# Patient Record
Sex: Female | Born: 1965
Health system: Southern US, Community
[De-identification: ages and names within clinical notes are randomized; demographics above are authoritative.]

## PROBLEM LIST (undated history)

## (undated) DIAGNOSIS — W540XXA Bitten by dog, initial encounter: Secondary | ICD-10-CM

## (undated) DIAGNOSIS — J349 Unspecified disorder of nose and nasal sinuses: Secondary | ICD-10-CM

## (undated) DIAGNOSIS — A64 Unspecified sexually transmitted disease: Secondary | ICD-10-CM

## (undated) DIAGNOSIS — G43909 Migraine, unspecified, not intractable, without status migrainosus: Secondary | ICD-10-CM

## (undated) HISTORY — DX: Bitten by dog, initial encounter: W54.0XXA

## (undated) HISTORY — DX: Unspecified sexually transmitted disease: A64

## (undated) HISTORY — DX: Migraine, unspecified, not intractable, without status migrainosus: G43.909

## (undated) HISTORY — DX: Unspecified disorder of nose and nasal sinuses: J34.9

---

## 1999-03-03 ENCOUNTER — Inpatient Hospital Stay (HOSPITAL_COMMUNITY): Admission: AD | Admit: 1999-03-03 | Discharge: 1999-03-06 | Payer: Self-pay | Admitting: Obstetrics and Gynecology

## 1999-04-14 ENCOUNTER — Other Ambulatory Visit: Admission: RE | Admit: 1999-04-14 | Discharge: 1999-04-14 | Payer: Self-pay | Admitting: Obstetrics and Gynecology

## 2000-11-14 ENCOUNTER — Other Ambulatory Visit: Admission: RE | Admit: 2000-11-14 | Discharge: 2000-11-14 | Payer: Self-pay | Admitting: Obstetrics and Gynecology

## 2003-10-20 ENCOUNTER — Other Ambulatory Visit: Admission: RE | Admit: 2003-10-20 | Discharge: 2003-10-20 | Payer: Self-pay | Admitting: Obstetrics and Gynecology

## 2004-10-26 ENCOUNTER — Other Ambulatory Visit: Admission: RE | Admit: 2004-10-26 | Discharge: 2004-10-26 | Payer: Self-pay | Admitting: Obstetrics and Gynecology

## 2005-12-08 ENCOUNTER — Other Ambulatory Visit: Admission: RE | Admit: 2005-12-08 | Discharge: 2005-12-08 | Payer: Self-pay | Admitting: Obstetrics and Gynecology

## 2007-03-08 ENCOUNTER — Other Ambulatory Visit: Admission: RE | Admit: 2007-03-08 | Discharge: 2007-03-08 | Payer: Self-pay | Admitting: Obstetrics and Gynecology

## 2008-03-12 ENCOUNTER — Other Ambulatory Visit: Admission: RE | Admit: 2008-03-12 | Discharge: 2008-03-12 | Payer: Self-pay | Admitting: Obstetrics and Gynecology

## 2008-07-03 HISTORY — PX: POLYPECTOMY: SHX149

## 2009-04-15 ENCOUNTER — Other Ambulatory Visit: Admission: RE | Admit: 2009-04-15 | Discharge: 2009-04-15 | Payer: Self-pay | Admitting: Obstetrics and Gynecology

## 2009-04-20 ENCOUNTER — Encounter: Admission: RE | Admit: 2009-04-20 | Discharge: 2009-04-20 | Payer: Self-pay | Admitting: Obstetrics and Gynecology

## 2009-04-27 ENCOUNTER — Encounter: Admission: RE | Admit: 2009-04-27 | Discharge: 2009-04-27 | Payer: Self-pay | Admitting: Obstetrics and Gynecology

## 2009-05-13 ENCOUNTER — Other Ambulatory Visit: Admission: RE | Admit: 2009-05-13 | Discharge: 2009-05-13 | Payer: Self-pay | Admitting: Obstetrics and Gynecology

## 2010-04-21 ENCOUNTER — Encounter: Admission: RE | Admit: 2010-04-21 | Discharge: 2010-04-21 | Payer: Self-pay | Admitting: Obstetrics and Gynecology

## 2011-03-27 ENCOUNTER — Other Ambulatory Visit: Payer: Self-pay | Admitting: Obstetrics and Gynecology

## 2011-03-27 DIAGNOSIS — Z1231 Encounter for screening mammogram for malignant neoplasm of breast: Secondary | ICD-10-CM

## 2011-04-27 ENCOUNTER — Ambulatory Visit
Admission: RE | Admit: 2011-04-27 | Discharge: 2011-04-27 | Disposition: A | Discharge status: Home / Self Care | Payer: BC Managed Care – PPO | Source: Ambulatory Visit | Attending: Obstetrics and Gynecology | Admitting: Obstetrics and Gynecology

## 2011-04-27 DIAGNOSIS — Z1231 Encounter for screening mammogram for malignant neoplasm of breast: Secondary | ICD-10-CM

## 2012-04-11 ENCOUNTER — Other Ambulatory Visit: Payer: Self-pay | Admitting: Obstetrics and Gynecology

## 2012-04-11 DIAGNOSIS — Z1231 Encounter for screening mammogram for malignant neoplasm of breast: Secondary | ICD-10-CM

## 2012-05-03 ENCOUNTER — Ambulatory Visit
Admission: RE | Admit: 2012-05-03 | Discharge: 2012-05-03 | Disposition: A | Discharge status: Home / Self Care | Payer: BC Managed Care – PPO | Source: Ambulatory Visit | Attending: Obstetrics and Gynecology | Admitting: Obstetrics and Gynecology

## 2012-05-03 DIAGNOSIS — Z1231 Encounter for screening mammogram for malignant neoplasm of breast: Secondary | ICD-10-CM

## 2012-09-26 ENCOUNTER — Other Ambulatory Visit: Payer: Self-pay | Admitting: Neurology

## 2012-09-30 ENCOUNTER — Telehealth: Payer: Self-pay | Admitting: *Deleted

## 2012-09-30 NOTE — Telephone Encounter (Signed)
Patient calling needing a follow up appointment so that she could get a refill on Relpax 40 mg. CM/Willis

## 2012-10-02 ENCOUNTER — Other Ambulatory Visit: Payer: Self-pay | Admitting: Neurology

## 2012-10-04 ENCOUNTER — Telehealth: Payer: Self-pay

## 2012-10-04 MED ORDER — ELETRIPTAN HYDROBROMIDE 40 MG PO TABS: 40.0000 mg | ORAL_TABLET | ORAL | Status: DC | PRN

## 2012-10-04 NOTE — Telephone Encounter (Signed)
Patient called requesting refill on Relpax be sent to [facility].

## 2012-11-16 ENCOUNTER — Encounter: Payer: Self-pay | Admitting: Nurse Practitioner

## 2012-11-18 ENCOUNTER — Ambulatory Visit: Payer: Self-pay | Admitting: Nurse Practitioner

## 2012-12-03 ENCOUNTER — Encounter: Payer: Self-pay | Admitting: Certified Nurse Midwife

## 2012-12-05 ENCOUNTER — Ambulatory Visit (INDEPENDENT_AMBULATORY_CARE_PROVIDER_SITE_OTHER): Payer: BC Managed Care – PPO | Admitting: Certified Nurse Midwife

## 2012-12-05 ENCOUNTER — Encounter: Payer: Self-pay | Admitting: Certified Nurse Midwife

## 2012-12-05 ENCOUNTER — Ambulatory Visit (INDEPENDENT_AMBULATORY_CARE_PROVIDER_SITE_OTHER): Payer: BC Managed Care – PPO | Admitting: Nurse Practitioner

## 2012-12-05 ENCOUNTER — Encounter: Payer: Self-pay | Admitting: Nurse Practitioner

## 2012-12-05 VITALS — BP 94/62 | Ht 65.25 in | Wt 173.0 lb

## 2012-12-05 VITALS — BP 104/69 | HR 88 | Ht 65.5 in | Wt 174.0 lb

## 2012-12-05 DIAGNOSIS — IMO0001 Reserved for inherently not codable concepts without codable children: Secondary | ICD-10-CM

## 2012-12-05 DIAGNOSIS — G43009 Migraine without aura, not intractable, without status migrainosus: Secondary | ICD-10-CM | POA: Insufficient documentation

## 2012-12-05 DIAGNOSIS — Z309 Encounter for contraceptive management, unspecified: Secondary | ICD-10-CM

## 2012-12-05 DIAGNOSIS — Z01419 Encounter for gynecological examination (general) (routine) without abnormal findings: Secondary | ICD-10-CM

## 2012-12-05 DIAGNOSIS — G43019 Migraine without aura, intractable, without status migrainosus: Secondary | ICD-10-CM | POA: Insufficient documentation

## 2012-12-05 MED ORDER — TOPIRAMATE 25 MG PO TABS: 50.0000 mg | ORAL_TABLET | Freq: Two times a day (BID) | ORAL | Status: DC

## 2012-12-05 MED ORDER — NORETHIN ACE-ETH ESTRAD-FE 1-20 MG-MCG(24) PO CHEW
1.0000 | CHEWABLE_TABLET | Freq: Every day | ORAL | Status: DC
Start: 2012-12-05 — End: 2012-12-05

## 2012-12-05 NOTE — Progress Notes (Signed)
HPI: Returns for a yearly followup for migraines. She has tried Depakote in the past but had side effects of significant tremor. She is currently on Topamax 50 mg twice daily tolerating the medication without side effects and her headache frequency is 1 headache per month or less. She takes Relpax acutely with relief . Her headaches continue to be unilateral and frontotemporal and can be associated with nausea and vomiting, photophobia, and photosensitivity. She has not missed any work due to her headaches. 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 No new medical issues since last seen  ROS:  Negative  Physical Exam General: well developed, well nourished, seated, in no evident distress Head: head normocephalic and atraumatic. Oropharynx benign Neck: supple with no carotid  bruits Cardiovascular: regular rate and rhythm, no murmurs  Neurologic Exam Mental Status: Awake and fully alert. Oriented to place and time. Follows all commands. Speech and language normal.   Cranial Nerves:  Pupils equal, briskly reactive to light. Extraocular movements full without nystagmus. Visual fields full to confrontation. Hearing intact and symmetric to finger snap. Facial sensation intact. Face, tongue, palate move normally and symmetrically. Neck flexion and extension normal.  Motor: Normal bulk and tone. Normal strength in all tested extremity muscles.No focal weakness Sensory.: intact to touch and pinprick and vibratory.  Coordination: Rapid alternating movements normal in all extremities. Finger-to-nose and heel-to-shin performed accurately bilaterally. Gait and Station: Arises from chair without difficulty. Stance is normal. Gait demonstrates normal stride length and balance . Able to heel, toe and tandem walk without difficulty.  Reflexes: 2+ and symmetric. Toes downgoing.     ASSESSMENT: Migraine headaches in excellent control with Topamax     PLAN: Continue topiramate and Relpax. Will renew Followup yearly  and when necessary Nilda Riggs, GNP-BC APRN

## 2012-12-05 NOTE — Progress Notes (Signed)
47 y.o. G82P1001 Married Caucasian Fe here for annual exam. Periods normal now with chewing Minastrin 24 Fe. Busy with work, no health issues today. Sees PCP for aex and labs.    Patient's last menstrual period was 09/05/2012.          Sexually active: no  The current method of family planning is OCP (estrogen/progesterone).    Exercising: yes  walking Smoker:  no  Health Maintenance: Pap: 6--4-13 neg HPV HR neg MMG:  05-03-12 Colonoscopy:  2007 BMD:   none TDaP:  08-17-11 Labs: none Self breast exam: monthly   reports that she has never smoked. She has never used smokeless tobacco. She reports that she does not drink alcohol or use illicit drugs.  Past Medical History  Diagnosis Date  . Migraine   . Sinus problem   . STD (sexually transmitted disease)     HSV 2  . Migraines     no aura    Past Surgical History  Procedure Laterality Date  . Cesarean section  2000  . Polypectomy  2010    Current Outpatient Prescriptions  Medication Sig Dispense Refill  . eletriptan (RELPAX) 40 MG tablet One tablet by mouth at onset of headache. May repeat in 2 hours if headache persists or recurs. may repeat in 2 hours if necessary  6 tablet  1  . loratadine (CLARITIN) 10 MG tablet Take 10 mg by mouth as needed for allergies.      . Norethin Ace-Eth Estrad-FE (MINASTRIN 24 FE) 1-20 MG-MCG(24) CHEW Chew by mouth daily.      Marland Kitchen topiramate (TOPAMAX) 25 MG tablet Take 25 mg by mouth 2 (two) times daily. Take 2 po bid       No current facility-administered medications for this visit.    Family History  Problem Relation Age of Onset  . Cancer - Colon Paternal Grandmother   . Heart disease Maternal Grandmother   . Hypertension Mother     ROS:  Pertinent items are noted in HPI.  Otherwise, a comprehensive ROS was negative.  Exam:   BP 94/62  Ht 5' 5.25" (1.657 m)  Wt 173 lb (78.472 kg)  BMI 28.58 kg/m2  LMP 09/05/2012 Height: 5' 5.25" (165.7 cm)  Ht Readings from Last 3 Encounters:   12/05/12 5' 5.25" (1.657 m)  11/16/12 5\' 6"  (1.676 m)    General appearance: alert, cooperative and appears stated age Head: Normocephalic, without obvious abnormality, atraumatic Neck: no adenopathy, supple, symmetrical, trachea midline and thyroid normal to inspection and palpation Lungs: clear to auscultation bilaterally Breasts: normal appearance, no masses or tenderness, Inspection negative, No nipple retraction or dimpling, No nipple discharge or bleeding, No axillary or supraclavicular adenopathy Heart: regular rate and rhythm Abdomen: soft, non-tender; no masses,  no organomegaly Extremities: extremities normal, atraumatic, no cyanosis or edema Skin: Skin color, texture, turgor normal. No rashes or lesions Lymph nodes: Cervical, supraclavicular, and axillary nodes normal. No abnormal inguinal nodes palpated Neurologic: Grossly normal   Pelvic: External genitalia:  no lesions              Urethra:  normal appearing urethra with no masses, tenderness or lesions              Bartholin's and Skene's: normal                 Vagina: normal appearing vagina with normal color and discharge, no lesions              Cervix:  normal, non tender              Pap taken: no Bimanual Exam:  Uterus:  normal size, contour, position, consistency, mobility, non-tender and mid position              Adnexa: normal adnexa and no mass, fullness, tenderness               Rectovaginal: Confirms               Anus:  normal sphincter tone, no lesions  A:  Well Woman with normal exam  Contraception: OCP  P:   Reviewed health and wellness pertinent to exam  Rx minastrin24 Fe  Pap smear as per guidelines   Mammogram yearly pap smear not taken  counseled on mammography screening, use and side effects of OCP's, adequate intake of calcium and vitamin D, diet and exercise  return annually or prn  An After Visit Summary was printed and given to the patient.  Reviewed, TL

## 2012-12-05 NOTE — Patient Instructions (Addendum)
Continue Topamax will renew Continue Relpax F/U yearly and prn

## 2012-12-05 NOTE — Patient Instructions (Addendum)

## 2012-12-05 NOTE — Progress Notes (Signed)
I have read the note, and I agree with the clinical assessment and plan.  

## 2012-12-22 ENCOUNTER — Other Ambulatory Visit: Payer: Self-pay | Admitting: Neurology

## 2013-02-19 ENCOUNTER — Ambulatory Visit: Payer: Self-pay | Admitting: Nurse Practitioner

## 2013-03-28 ENCOUNTER — Other Ambulatory Visit: Payer: Self-pay

## 2013-03-28 MED ORDER — ELETRIPTAN HYDROBROMIDE 40 MG PO TABS: 40.0000 mg | ORAL_TABLET | ORAL | Status: DC | PRN

## 2013-03-28 NOTE — Telephone Encounter (Signed)
Patient called stating her insurance will be changing as of Oct 1st.  She would like a new Rx for Relpax called into [facility] for 90 days.

## 2013-05-12 ENCOUNTER — Other Ambulatory Visit: Payer: Self-pay

## 2013-05-12 DIAGNOSIS — Z1231 Encounter for screening mammogram for malignant neoplasm of breast: Secondary | ICD-10-CM

## 2013-06-02 ENCOUNTER — Telehealth: Payer: Self-pay | Admitting: Certified Nurse Midwife

## 2013-06-02 MED ORDER — NORETHIN ACE-ETH ESTRAD-FE 1-20 MG-MCG(24) PO CHEW: 1.0000 | CHEWABLE_TABLET | Freq: Every day | ORAL | Status: DC

## 2013-06-02 NOTE — Telephone Encounter (Signed)
Minastrin 24 FE chewable. Patient take three months continuously and omits last week due to migraines. Patient has new insurance and needs a new RX to: [employer] [phone]

## 2013-06-02 NOTE — Telephone Encounter (Signed)
AEX was was 12/05/12 #1 pack with 12 refills was sent to local pharmacy, [facility].   #90/2 [facility] to last patient until AEX  Patient notified.

## 2013-06-03 ENCOUNTER — Other Ambulatory Visit: Payer: Self-pay | Admitting: *Deleted

## 2013-06-03 MED ORDER — NORETHIN ACE-ETH ESTRAD-FE 1-20 MG-MCG(24) PO CHEW: 1.0000 | CHEWABLE_TABLET | Freq: Every day | ORAL | Status: DC

## 2013-06-09 ENCOUNTER — Ambulatory Visit
Admission: RE | Admit: 2013-06-09 | Discharge: 2013-06-09 | Disposition: A | Discharge status: Home / Self Care | Payer: No Typology Code available for payment source | Source: Ambulatory Visit

## 2013-06-09 DIAGNOSIS — Z1231 Encounter for screening mammogram for malignant neoplasm of breast: Secondary | ICD-10-CM

## 2013-12-05 ENCOUNTER — Ambulatory Visit (INDEPENDENT_AMBULATORY_CARE_PROVIDER_SITE_OTHER): Payer: No Typology Code available for payment source | Admitting: Nurse Practitioner

## 2013-12-05 ENCOUNTER — Encounter: Payer: Self-pay | Admitting: Nurse Practitioner

## 2013-12-05 ENCOUNTER — Encounter (INDEPENDENT_AMBULATORY_CARE_PROVIDER_SITE_OTHER): Payer: Self-pay

## 2013-12-05 VITALS — BP 112/77 | HR 79 | Ht 65.5 in | Wt 171.0 lb

## 2013-12-05 DIAGNOSIS — G43019 Migraine without aura, intractable, without status migrainosus: Secondary | ICD-10-CM

## 2013-12-05 DIAGNOSIS — G43009 Migraine without aura, not intractable, without status migrainosus: Secondary | ICD-10-CM

## 2013-12-05 MED ORDER — ELETRIPTAN HYDROBROMIDE 40 MG PO TABS: 40.0000 mg | ORAL_TABLET | ORAL | Status: DC | PRN

## 2013-12-05 MED ORDER — TOPIRAMATE 25 MG PO TABS
50.0000 mg | ORAL_TABLET | Freq: Two times a day (BID) | ORAL | Status: DC
Start: 2013-12-05 — End: 2014-12-07

## 2013-12-05 NOTE — Patient Instructions (Signed)
Continue Topamax at current dose will refill Continue Relpax at current dose will refill, co-pay card given Followup yearly and when necessary

## 2013-12-05 NOTE — Progress Notes (Signed)
I have read the note, and I agree with the clinical assessment and plan.  Wiktoria Hemrick K Malaya Cagley   

## 2013-12-05 NOTE — Progress Notes (Signed)
[facility]  PATIENT: Rachel Anthony DOB: February 16, 1966   REASON FOR VISIT: Followup for migraine  HISTORY OF PRESENT ILLNESS: Rachel Anthony, 48 year old female returns for followup. She has history of migraines. She was last seen 12/05/2012.She has tried Depakote in the past but had side effects of significant tremor. She is currently on Topamax 50 mg twice daily tolerating the medication without side effects and her headache frequency is 1 headache per month or less. She takes Relpax acutely with relief . Her headaches continue to be unilateral and frontotemporal and can be associated with nausea and vomiting, photophobia, and photosensitivity. She has not missed any work due to her headaches. 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 No new medical issues since last seen. She does not have a primary care physician   REVIEW OF SYSTEMS: Full 14 system review of systems performed and notable only for those listed, all others are neg:  Constitutional: N/A  Cardiovascular: N/A  Ear/Nose/Throat: N/A  Skin: N/A  Eyes: N/A  Respiratory: N/A  Gastroitestinal: N/A  Hematology/Lymphatic: N/A  Endocrine: N/A Musculoskeletal:N/A  Allergy/Immunology: N/A  Neurological: Headache Psychiatric: N/A Sleep : NA   ALLERGIES: No Known Allergies  HOME MEDICATIONS: Outpatient Prescriptions Prior to Visit  Medication Sig Dispense Refill  . eletriptan (RELPAX) 40 MG tablet Take 1 tablet (40 mg total) by mouth as needed for migraine. May repeat in 2 hours if headache persists or recurs. (90 day Rx)  18 tablet  1  . loratadine (CLARITIN) 10 MG tablet Take 10 mg by mouth as needed for allergies.      . Norethin Ace-Eth Estrad-FE (MINASTRIN 24 FE) 1-20 MG-MCG(24) CHEW Chew 1 tablet by mouth daily.  90 tablet  2  . topiramate (TOPAMAX) 25 MG tablet Take 2 tablets (50 mg total) by mouth 2 (two) times daily.  120 tablet  11   No facility-administered medications prior to visit.    PAST MEDICAL  HISTORY: Past Medical History  Diagnosis Date  . Migraine   . Sinus problem   . STD (sexually transmitted disease)     HSV 2  . Migraines     no aura    PAST SURGICAL HISTORY: Past Surgical History  Procedure Laterality Date  . Cesarean section  2000  . Polypectomy  2010    FAMILY HISTORY: Family History  Problem Relation Age of Onset  . Cancer - Colon Paternal Grandmother   . Heart disease Maternal Grandmother   . Hypertension Mother     SOCIAL HISTORY: History   Social History  . Marital Status: Divorced    Spouse Name: N/A    Number of Children: 1  . Years of Education: 12+   Occupational History  .     Social History Main Topics  . Smoking status: Never Smoker   . Smokeless tobacco: Never Used  . Alcohol Use: No  . Drug Use: No  . Sexual Activity: No   Other Topics Concern  . Not on file   Social History Narrative   Patient works as a Education officer, museum at [employer] for 13 years and has her masters degree. The patient is divorced and lives with her daughter.      PHYSICAL EXAM  Filed Vitals:   12/05/13 1029  BP: 112/77  Pulse: 79  Height: 5' 5.5" (1.664 m)  Weight: 171 lb (77.565 kg)   Body mass index is 28.01 kg/(m^2).  Generalized: Well developed, in no acute distress  Head: normocephalic and atraumatic,. Oropharynx benign  Neck: Supple, no carotid bruits  Cardiac: Regular rate rhythm, no murmur  Musculoskeletal: No deformity   Neurological examination   Mentation: Alert oriented to time, place, history taking. Follows all commands speech and language fluent  Cranial nerve II-XII: Pupils were equal round reactive to light extraocular movements were full, visual field were full on confrontational test. Facial sensation and strength were normal. hearing was intact to finger rubbing bilaterally. Uvula tongue midline. head turning and shoulder shrug were normal and symmetric.Tongue protrusion into cheek strength was  normal. Motor: normal bulk and tone, full strength in the BUE, BLE, fine finger movements normal, no pronator drift. No focal weakness Sensory: normal and symmetric to light touch, pinprick, and  vibration  Coordination: finger-nose-finger, heel-to-shin bilaterally, no dysmetria Reflexes: Brachioradialis 2/2, biceps 2/2, triceps 2/2, patellar 2/2, Achilles 2/2, plantar responses were flexor bilaterally. Gait and Station: Rising up from seated position without assistance, normal stance,  moderate stride, good arm swing, smooth turning, able to perform tiptoe, and heel walking without difficulty. Tandem gait is steady  DIAGNOSTIC DATA (LABS, IMAGING, TESTING) -    ASSESSMENT AND PLAN  48 y.o. year old female  has a past medical history of Migraine; here to followup. She is currently well controlled with Topamax.  Continue Topamax at current dose will refill Continue Relpax at current dose will refill, co-pay card given Followup yearly and when necessary Dennie Bible, [facility], [facility], [facility] [address], [place],  [address] [phone]

## 2013-12-11 ENCOUNTER — Ambulatory Visit (INDEPENDENT_AMBULATORY_CARE_PROVIDER_SITE_OTHER): Payer: No Typology Code available for payment source | Admitting: Certified Nurse Midwife

## 2013-12-11 ENCOUNTER — Encounter: Payer: Self-pay | Admitting: Certified Nurse Midwife

## 2013-12-11 VITALS — BP 106/62 | HR 70 | Resp 16 | Ht 64.75 in | Wt 169.0 lb

## 2013-12-11 DIAGNOSIS — Z01419 Encounter for gynecological examination (general) (routine) without abnormal findings: Secondary | ICD-10-CM

## 2013-12-11 DIAGNOSIS — Z124 Encounter for screening for malignant neoplasm of cervix: Secondary | ICD-10-CM

## 2013-12-11 DIAGNOSIS — Z309 Encounter for contraceptive management, unspecified: Secondary | ICD-10-CM

## 2013-12-11 MED ORDER — NORETHIN ACE-ETH ESTRAD-FE 1-20 MG-MCG(24) PO CHEW: 1.0000 | CHEWABLE_TABLET | Freq: Every day | ORAL | Status: DC

## 2013-12-11 NOTE — Progress Notes (Signed)
48 y.o. G76P1001 Divorced Caucasian Fe here for annual exam. Periods every 3 months with OCP ues, normal, no issues.  Contraception working well,no issues. Sees PCP aex/ labs yearly. Sees Neurology yearly for migraines. No health issues. Going on mission trip to [place] this summer!  Patient's last menstrual period was 09/25/2013.          Sexually active: no  The current method of family planning is OCP (estrogen/progesterone).    Exercising: yes  walking Smoker:  no  Health Maintenance: Pap: 12-05-11 neg HPV HR neg MMG: 06-09-13 normal Colonoscopy:  2007 BMD:   none TDaP:  2013 Labs: none Self breast exam: done monthly   reports that she has never smoked. She has never used smokeless tobacco. She reports that she does not drink alcohol or use illicit drugs.  Past Medical History  Diagnosis Date  . Migraine   . Sinus problem   . STD (sexually transmitted disease)     HSV 2  . Migraines     no aura    Past Surgical History  Procedure Laterality Date  . Cesarean section  2000  . Polypectomy  2010    Current Outpatient Prescriptions  Medication Sig Dispense Refill  . eletriptan (RELPAX) 40 MG tablet Take 1 tablet (40 mg total) by mouth as needed for migraine. May repeat in 2 hours if headache persists or recurs. (90 day Rx)  18 tablet  3  . loratadine (CLARITIN) 10 MG tablet Take 10 mg by mouth as needed for allergies.      . Norethin Ace-Eth Estrad-FE (MINASTRIN 24 FE) 1-20 MG-MCG(24) CHEW Chew 1 tablet by mouth daily.  90 tablet  2  . topiramate (TOPAMAX) 25 MG tablet Take 2 tablets (50 mg total) by mouth 2 (two) times daily.  120 tablet  11   No current facility-administered medications for this visit.    Family History  Problem Relation Age of Onset  . Cancer - Colon Paternal Grandmother   . Heart disease Maternal Grandmother   . Hypertension Mother     ROS:  Pertinent items are noted in HPI.  Otherwise, a comprehensive ROS was negative.  Exam:   BP  106/62  Pulse 70  Resp 16  Ht 5' 4.75" (1.645 m)  Wt 169 lb (76.658 kg)  BMI 28.33 kg/m2  LMP 09/25/2013 Height: 5' 4.75" (164.5 cm)  Ht Readings from Last 3 Encounters:  12/11/13 5' 4.75" (1.645 m)  12/05/13 5' 5.5" (1.664 m)  12/05/12 5' 5.5" (1.664 m)    General appearance: alert, cooperative and appears stated age Head: Normocephalic, without obvious abnormality, atraumatic Neck: no adenopathy, supple, symmetrical, trachea midline and thyroid normal to inspection and palpation and non-palpable Lungs: clear to auscultation bilaterally Breasts: normal appearance, no masses or tenderness, No nipple retraction or dimpling, No nipple discharge or bleeding, No axillary or supraclavicular adenopathy Heart: regular rate and rhythm Abdomen: soft, non-tender; no masses,  no organomegaly Extremities: extremities normal, atraumatic, no cyanosis or edema Skin: Skin color, texture, turgor normal. No rashes or lesions Lymph nodes: Cervical, supraclavicular, and axillary nodes normal. No abnormal inguinal nodes palpated Neurologic: Grossly normal   Pelvic: External genitalia:  no lesions              Urethra:  normal appearing urethra with no masses, tenderness or lesions              Bartholin's and Skene's: normal  Vagina: normal appearing vagina with normal color and discharge, no lesions              Cervix: normal, non tender              Pap taken: yes Bimanual Exam:  Uterus:  normal size, contour, position, consistency, mobility, non-tender, anteverted and retroverted              Adnexa: normal adnexa and no mass, fullness, tenderness               Rectovaginal: Confirms               Anus:  normal sphincter tone, no lesions  A:  Well Woman with normal exam  Contraception cycle control only with OCP  Migraine headache under neurology management    P:   Reviewed health and wellness pertinent to exam  Rx Minastrin 24 see order  Continue follow up with neurology  as indicated  Pap smear taken today with HPV reflex   counseled on breast self exam, mammography screening, adequate intake of calcium and vitamin D, diet and exercise return annually or prn  An After Visit Summary was printed and given to the patient.

## 2013-12-11 NOTE — Patient Instructions (Signed)

## 2013-12-12 LAB — IPS PAP TEST WITH REFLEX TO HPV

## 2013-12-13 NOTE — Progress Notes (Signed)
Reviewed personally.  M. Suzanne Dystany Duffy, MD.  

## 2014-05-04 ENCOUNTER — Encounter: Payer: Self-pay | Admitting: Certified Nurse Midwife

## 2014-07-09 ENCOUNTER — Other Ambulatory Visit: Payer: Self-pay

## 2014-07-09 DIAGNOSIS — Z1231 Encounter for screening mammogram for malignant neoplasm of breast: Secondary | ICD-10-CM

## 2014-07-15 ENCOUNTER — Other Ambulatory Visit: Payer: Self-pay | Admitting: Family Medicine

## 2014-07-15 DIAGNOSIS — R222 Localized swelling, mass and lump, trunk: Secondary | ICD-10-CM

## 2014-07-15 DIAGNOSIS — R2230 Localized swelling, mass and lump, unspecified upper limb: Secondary | ICD-10-CM

## 2014-07-16 ENCOUNTER — Ambulatory Visit: Payer: No Typology Code available for payment source

## 2014-07-21 ENCOUNTER — Ambulatory Visit
Admission: RE | Admit: 2014-07-21 | Discharge: 2014-07-21 | Disposition: A | Discharge status: Home / Self Care | Payer: No Typology Code available for payment source | Source: Ambulatory Visit | Attending: Family Medicine | Admitting: Family Medicine

## 2014-07-21 DIAGNOSIS — R2230 Localized swelling, mass and lump, unspecified upper limb: Secondary | ICD-10-CM

## 2014-07-21 DIAGNOSIS — R222 Localized swelling, mass and lump, trunk: Secondary | ICD-10-CM

## 2014-09-02 ENCOUNTER — Telehealth: Payer: Self-pay | Admitting: Nurse Practitioner

## 2014-09-02 NOTE — Telephone Encounter (Signed)
Pt is calling stating that [facility] needs to get prior auth on eletriptan (RELPAX) 40 MG tablet. Pt states she only has one pill left. Please call and advise.

## 2014-09-02 NOTE — Telephone Encounter (Signed)
All clinical info has been provided to ins, request is under review.  I called the patient back.  Got no answer.  Left message.

## 2014-09-04 ENCOUNTER — Telehealth: Payer: Self-pay

## 2014-09-04 NOTE — Telephone Encounter (Signed)
Rachel Anthony has approved the request for coverage on Relpax effective until 09/03/2015 Ref Auth # [id number]

## 2014-12-07 ENCOUNTER — Ambulatory Visit (INDEPENDENT_AMBULATORY_CARE_PROVIDER_SITE_OTHER): Payer: No Typology Code available for payment source | Admitting: Nurse Practitioner

## 2014-12-07 ENCOUNTER — Ambulatory Visit: Payer: No Typology Code available for payment source | Admitting: Nurse Practitioner

## 2014-12-07 ENCOUNTER — Encounter: Payer: Self-pay | Admitting: Nurse Practitioner

## 2014-12-07 VITALS — BP 114/71 | HR 61 | Ht 65.5 in | Wt 154.5 lb

## 2014-12-07 DIAGNOSIS — G43009 Migraine without aura, not intractable, without status migrainosus: Secondary | ICD-10-CM

## 2014-12-07 MED ORDER — ELETRIPTAN HYDROBROMIDE 40 MG PO TABS: 40.0000 mg | ORAL_TABLET | ORAL | Status: DC | PRN

## 2014-12-07 MED ORDER — TOPIRAMATE 25 MG PO TABS: 50.0000 mg | ORAL_TABLET | Freq: Two times a day (BID) | ORAL | Status: DC

## 2014-12-07 NOTE — Progress Notes (Signed)
I have read the note, and I agree with the clinical assessment and plan.  Houston Zapien KEITH   

## 2014-12-07 NOTE — Progress Notes (Signed)
[facility]  PATIENT: Rachel Anthony DOB: 1965/08/04   REASON FOR VISIT: Follow-up for migraines  HISTORY FROM: Patient    HISTORY OF PRESENT ILLNESS:[name], 49 year old female returns for followup. She has history of migraines. She was last seen 12/05/2013.She has tried Depakote in the past but had side effects of significant tremor. She is currently on Topamax 50 mg twice daily tolerating the medication without side effects and her headache frequency is 1 headache per month or less. She takes Relpax acutely with relief . Her headaches continue to be unilateral and frontotemporal and can be associated with nausea and vomiting, photophobia, and photosensitivity. She has not missed any work due to her headaches. 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 No new medical issues since last seen. She returns for reevaluation   REVIEW OF SYSTEMS: Full 14 system review of systems performed and notable only for those listed, all others are neg:  Constitutional: neg  Cardiovascular: neg Ear/Nose/Throat: neg  Skin: neg Eyes: neg Respiratory: neg Gastroitestinal: neg  Hematology/Lymphatic: neg  Endocrine: neg Musculoskeletal:neg Allergy/Immunology: neg Neurological: Headaches Psychiatric: neg Sleep : neg   ALLERGIES: No Known Allergies  HOME MEDICATIONS: Outpatient Prescriptions Prior to Visit  Medication Sig Dispense Refill  . eletriptan (RELPAX) 40 MG tablet Take 1 tablet (40 mg total) by mouth as needed for migraine. May repeat in 2 hours if headache persists or recurs. (90 day Rx) 18 tablet 3  . loratadine (CLARITIN) 10 MG tablet Take 10 mg by mouth as needed for allergies.    Marland Kitchen topiramate (TOPAMAX) 25 MG tablet Take 2 tablets (50 mg total) by mouth 2 (two) times daily. 120 tablet 11  . Norethin Ace-Eth Estrad-FE (MINASTRIN 24 FE) 1-20 MG-MCG(24) CHEW Chew 1 tablet by mouth daily. 90 tablet 4   No facility-administered medications prior to visit.    PAST  MEDICAL HISTORY: Past Medical History  Diagnosis Date  . Migraine   . Sinus problem   . STD (sexually transmitted disease)     HSV 2  . Migraines     no aura    PAST SURGICAL HISTORY: Past Surgical History  Procedure Laterality Date  . Cesarean section  2000  . Polypectomy  2010    FAMILY HISTORY: Family History  Problem Relation Age of Onset  . Cancer - Colon Paternal Grandmother   . Heart disease Maternal Grandmother   . Hypertension Mother     SOCIAL HISTORY: History   Social History  . Marital Status: Divorced    Spouse Name: N/A  . Number of Children: 1  . Years of Education: 12+   Occupational History  .     Social History Main Topics  . Smoking status: Never Smoker   . Smokeless tobacco: Never Used  . Alcohol Use: No  . Drug Use: No  . Sexual Activity: No   Other Topics Concern  . 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 The patient is divorced and lives with her daughter.      PHYSICAL EXAM  Filed Vitals:   12/07/14 1551  BP: 114/71  Pulse: 61  Height: 5' 5.5" (1.664 m)  Weight: 154 lb 8 oz (70.081 kg)   Body mass index is 25.31 kg/(m^2). Generalized: Well developed, in no acute distress  Head: normocephalic and atraumatic,. Oropharynx benign  Neck: Supple, no carotid bruits  Musculoskeletal: No deformity   Neurological examination   Mentation: Alert oriented to time, place, history taking. Follows all commands speech and language fluent  Cranial nerve II-XII: Pupils were equal round reactive to light extraocular movements were full, visual field were full on confrontational test. Facial sensation and strength were normal. hearing was intact to finger rubbing bilaterally. Uvula tongue midline. head turning and shoulder shrug were normal and symmetric.Tongue protrusion into cheek strength was normal. Motor: normal bulk  and tone, full strength in the BUE, BLE, fine finger movements normal, no pronator drift. No focal weakness Coordination: finger-nose-finger, heel-to-shin bilaterally, no dysmetria Reflexes: Brachioradialis 2/2, biceps 2/2, triceps 2/2, patellar 2/2, Achilles 2/2, plantar responses were flexor bilaterally. Gait and Station: Rising up from seated position without assistance, normal stance, moderate stride, good arm swing, smooth turning, able to perform tiptoe, and heel walking without difficulty. Tandem gait is steady  DIAGNOSTIC DATA (LABS, IMAGING, TESTING) - ASSESSMENT AND PLAN  49 y.o. year old female  has a past medical history of Migraine; here to follow-up. She has one headache per month relieved with Relpax acutely she is on Topamax as a preventive.  Continue Topamax at current dose will refill Continue Relpax at current dose will refill Call for an increase in headaches Follow-up yearly and when necessary Dennie Bible, [facility], [facility], APRN  [facility] [address] [place], [place] [address] [phone] L or stress is right-handed. He currently eats expensive for 60 crazy when JUST is status just is likely patient is

## 2014-12-07 NOTE — Patient Instructions (Signed)
Continue Topamax at current dose will refill Continue Relpax at current dose will refill Call for an increase in headaches Follow-up yearly and when necessary

## 2014-12-17 ENCOUNTER — Encounter: Payer: Self-pay | Admitting: Certified Nurse Midwife

## 2014-12-17 ENCOUNTER — Ambulatory Visit (INDEPENDENT_AMBULATORY_CARE_PROVIDER_SITE_OTHER): Payer: No Typology Code available for payment source | Admitting: Certified Nurse Midwife

## 2014-12-17 VITALS — BP 110/68 | HR 78 | Resp 16 | Ht 65.25 in | Wt 152.0 lb

## 2014-12-17 DIAGNOSIS — Z01419 Encounter for gynecological examination (general) (routine) without abnormal findings: Secondary | ICD-10-CM

## 2014-12-17 NOTE — Patient Instructions (Signed)

## 2014-12-17 NOTE — Progress Notes (Signed)
49 y.o. G12P1001 Divorced  Caucasian Fe here for annual exam.  Periods normal, no issues. Not sexually active now. Has been working on weight loss down 17 pounds!  Sees PCP prn. Sees neurology for migraines, no change in status. Busy with Hospice care. Stopped OCP use due to cost. Migraines are manageable without OCP's use now. Prefers to stay off of now. Sees PCP for aex/labs and migraine medication. No health issues today.  Patient's last menstrual period was 12/03/2014.          Sexually active: No.  The current method of family planning is abstinence.    Exercising: Yes.    walking Smoker:  no  Health Maintenance: Pap: 12-11-13 neg MMG:  07-21-14 category c density,birads 1:neg Colonoscopy:  2007 BMD:   none TDaP:  2013 Labs: none Self breast exam: done monthly   reports that she has never smoked. She has never used smokeless tobacco. She reports that she does not drink alcohol or use illicit drugs.  Past Medical History  Diagnosis Date  . Migraine   . Sinus problem   . STD (sexually transmitted disease)     HSV 2  . Migraines     no aura    Past Surgical History  Procedure Laterality Date  . Cesarean section  2000  . Polypectomy  2010    Current Outpatient Prescriptions  Medication Sig Dispense Refill  . eletriptan (RELPAX) 40 MG tablet Take 1 tablet (40 mg total) by mouth as needed for migraine. May repeat in 2 hours if headache persists or recurs. (90 day Rx) 6 tablet 11  . loratadine (CLARITIN) 10 MG tablet Take 10 mg by mouth as needed for allergies.    Marland Kitchen topiramate (TOPAMAX) 25 MG tablet Take 2 tablets (50 mg total) by mouth 2 (two) times daily. 120 tablet 11   No current facility-administered medications for this visit.    Family History  Problem Relation Age of Onset  . Cancer - Colon Paternal Grandmother   . Heart disease Maternal Grandmother   . Hypertension Mother     ROS:  Pertinent items are noted in HPI.  Otherwise, a comprehensive ROS was  negative.  Exam:   BP 110/68 mmHg  Pulse 78  Resp 16  Ht 5' 5.25" (1.657 m)  Wt 152 lb (68.947 kg)  BMI 25.11 kg/m2  LMP 12/03/2014 Height: 5' 5.25" (165.7 cm) Ht Readings from Last 3 Encounters:  12/17/14 5' 5.25" (1.657 m)  12/07/14 5' 5.5" (1.664 m)  12/11/13 5' 4.75" (1.645 m)    General appearance: alert, cooperative and appears stated age Head: Normocephalic, without obvious abnormality, atraumatic Neck: no adenopathy, supple, symmetrical, trachea midline and thyroid normal to inspection and palpation Lungs: clear to auscultation bilaterally Breasts: normal appearance, no masses or tenderness, No nipple retraction or dimpling, No nipple discharge or bleeding, No axillary or supraclavicular adenopathy Heart: regular rate and rhythm Abdomen: soft, non-tender; no masses,  no organomegaly Extremities: extremities normal, atraumatic, no cyanosis or edema Skin: Skin color, texture, turgor normal. No rashes or lesions Lymph nodes: Cervical, supraclavicular, and axillary nodes normal. No abnormal inguinal nodes palpated Neurologic: Grossly normal   Pelvic: External genitalia:  no lesions              Urethra:  normal appearing urethra with no masses, tenderness or lesions              Bartholin's and Skene's: normal  Vagina: normal appearing vagina with normal color and discharge, no lesions              Cervix: normal, non tender, no lesions              Pap taken: No. Bimanual Exam:  Uterus:  normal size, contour, position, consistency, mobility, non-tender and anteverted tilts to left              Adnexa: normal adnexa and no mass, fullness, tenderness               Rectovaginal: Confirms               Anus:  normal sphincter tone, no lesions  Chaperone present: Yes  A:  Well Woman with normal exam  Contraception abstinence  Migraine headaches stable with PCP management  P:   Reviewed health and wellness pertinent to exam  Continue follow up with MD as  indicated.  Pap smear not taken today    counseled on breast self exam, mammography screening, adequate intake of calcium and vitamin D, diet and exercise  return annually or prn  An After Visit Summary was printed and given to the patient.

## 2014-12-18 NOTE — Progress Notes (Signed)
Reviewed personally.  M. Suzanne Shailee Foots, MD.  

## 2015-08-30 ENCOUNTER — Other Ambulatory Visit: Payer: Self-pay

## 2015-08-30 DIAGNOSIS — Z1231 Encounter for screening mammogram for malignant neoplasm of breast: Secondary | ICD-10-CM

## 2015-09-02 ENCOUNTER — Ambulatory Visit
Admission: RE | Admit: 2015-09-02 | Discharge: 2015-09-02 | Disposition: A | Discharge status: Home / Self Care | Payer: Managed Care, Other (non HMO) | Source: Ambulatory Visit

## 2015-09-02 DIAGNOSIS — Z1231 Encounter for screening mammogram for malignant neoplasm of breast: Secondary | ICD-10-CM

## 2015-12-06 ENCOUNTER — Ambulatory Visit (INDEPENDENT_AMBULATORY_CARE_PROVIDER_SITE_OTHER): Payer: 59 | Admitting: Nurse Practitioner

## 2015-12-06 ENCOUNTER — Encounter: Payer: Self-pay | Admitting: Nurse Practitioner

## 2015-12-06 VITALS — BP 103/71 | HR 74 | Resp 16 | Ht 65.0 in | Wt 145.0 lb

## 2015-12-06 DIAGNOSIS — G43009 Migraine without aura, not intractable, without status migrainosus: Secondary | ICD-10-CM

## 2015-12-06 MED ORDER — ELETRIPTAN HYDROBROMIDE 40 MG PO TABS: 40.0000 mg | ORAL_TABLET | ORAL | Status: DC | PRN

## 2015-12-06 MED ORDER — TOPIRAMATE 25 MG PO TABS: 50.0000 mg | ORAL_TABLET | Freq: Two times a day (BID) | ORAL | Status: DC

## 2015-12-06 NOTE — Progress Notes (Signed)
[facility]  PATIENT: Rachel Anthony DOB: [birth year]   REASON FOR VISIT: Follow-up for migraine HISTORY FROM: Patient    HISTORY OF PRESENT ILLNESS:Rachel Anthony, 50 year old female returns for followup. She has history of migraines. She was last seen 12/07/2014. Marland KitchenShe has tried Depakote in the past but had side effects of significant tremor. She is currently on Topamax 50 mg twice daily tolerating the medication without side effects and her headache frequency is 1-2  headaches per month or less. She takes Relpax acutely with relief . Her headaches continue to be unilateral and frontotemporal and can be associated with nausea and vomiting, photophobia, and photosensitivity. She has not missed any work due to her headaches. 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 No new medical issues since last seen. She also has seasonal allergies. She returns for reevaluation   REVIEW OF SYSTEMS: Full 14 system review of systems performed and notable only for those listed, all others are neg:  Constitutional: neg  Cardiovascular: neg Ear/Nose/Throat: neg  Skin: neg Eyes: neg Respiratory: neg Gastroitestinal: neg  Hematology/Lymphatic: neg  Endocrine: neg Musculoskeletal:neg Allergy/Immunology: Seasonal Neurological: Headache  Psychiatric: neg Sleep : neg   ALLERGIES: No Known Allergies  HOME MEDICATIONS: Outpatient Prescriptions Prior to Visit  Medication Sig Dispense Refill  . eletriptan (RELPAX) 40 MG tablet Take 1 tablet (40 mg total) by mouth as needed for migraine. May repeat in 2 hours if headache persists or recurs. (90 day Rx) 6 tablet 11  . loratadine (CLARITIN) 10 MG tablet Take 10 mg by mouth as needed for allergies.    Marland Kitchen topiramate (TOPAMAX) 25 MG tablet Take 2 tablets (50 mg total) by mouth 2 (two) times daily. 120 tablet 11   No facility-administered medications prior to visit.    PAST MEDICAL HISTORY: Past Medical History  Diagnosis Date  . Migraine   .  Sinus problem   . STD (sexually transmitted disease)     HSV 2  . Migraines     no aura    PAST SURGICAL HISTORY: Past Surgical History  Procedure Laterality Date  . Cesarean section  2000  . Polypectomy  2010    FAMILY HISTORY: Family History  Problem Relation Age of Onset  . Cancer - Colon Paternal Grandmother   . Heart disease Maternal Grandmother   . Hypertension Mother     SOCIAL HISTORY: Social History   Social History  . Marital Status: Divorced    Spouse Name: N/A  . Number of Children: 1  . Years of Education: 12+   Occupational History  .     Social History Main Topics  . Smoking status: Never Smoker   . Smokeless tobacco: Never Used  . Alcohol Use: No  . Drug Use: No  . Sexual Activity: No   Other Topics Concern  . 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 The patient is divorced and lives with her daughter.      PHYSICAL EXAM  Filed Vitals:   12/06/15 0809  BP: 103/71  Pulse: 74  Resp: 16  Height: 5\' 5"  (1.651 m)  Weight: 145 lb (65.772 kg)   Body mass index is 24.13 kg/(m^2). Generalized: Well developed, in no acute distress  Head: normocephalic and atraumatic,. Oropharynx benign  Neck: Supple, no carotid bruits  Musculoskeletal: No deformity   Neurological examination   Mentation: Alert  oriented to time, place, history taking. Follows all commands speech and language fluent  Cranial nerve II-XII: Pupils were equal round reactive to light extraocular movements were full, visual field were full on confrontational test. Facial sensation and strength were normal. hearing was intact to finger rubbing bilaterally. Uvula tongue midline. head turning and shoulder shrug were normal and symmetric.Tongue protrusion into cheek strength was normal. Motor: normal bulk and tone, full strength in the BUE, BLE, fine finger movements  normal, no pronator drift. No focal weakness Coordination: finger-nose-finger, heel-to-shin bilaterally, no dysmetria Reflexes: Brachioradialis 2/2, biceps 2/2, triceps 2/2, patellar 2/2, Achilles 2/2, plantar responses were flexor bilaterally. Gait and Station: Rising up from seated position without assistance, normal stance, moderate stride, good arm swing, smooth turning, able to perform tiptoe, and heel walking without difficulty. Tandem gait is steady  DIAGNOSTIC DATA (LABS, IMAGING, TESTING)  ASSESSMENT AND PLAN 50 y.o. year old female has a past medical history of Migraine; here to follow-up. She has 1 to 2  headaches per month relieved with Relpax acutely , she is on Topamax as a preventive.  Continue Topamax 50mg  twice daily. will refill Continue Relpax at current dose will refill Call for an increase in headaches Follow-up yearly and when necessary Dennie Bible, [facility], [facility], [facility] [address] [place], [place] [address] [phone]

## 2015-12-06 NOTE — Patient Instructions (Signed)
Continue Topamax at current dose will refill Continue Relpax at current dose will refill Call for an increase in headaches Follow-up yearly and when necessary 

## 2015-12-06 NOTE — Progress Notes (Signed)
I have read the note, and I agree with the clinical assessment and plan.  Milarose Savich KEITH   

## 2015-12-21 ENCOUNTER — Ambulatory Visit (INDEPENDENT_AMBULATORY_CARE_PROVIDER_SITE_OTHER): Payer: 59 | Admitting: Certified Nurse Midwife

## 2015-12-21 ENCOUNTER — Encounter: Payer: Self-pay | Admitting: Certified Nurse Midwife

## 2015-12-21 VITALS — BP 100/62 | HR 76 | Resp 16 | Ht 64.75 in | Wt 143.0 lb

## 2015-12-21 DIAGNOSIS — Z124 Encounter for screening for malignant neoplasm of cervix: Secondary | ICD-10-CM

## 2015-12-21 DIAGNOSIS — Z01419 Encounter for gynecological examination (general) (routine) without abnormal findings: Secondary | ICD-10-CM | POA: Diagnosis not present

## 2015-12-21 NOTE — Progress Notes (Signed)
50 y.o. G64P1001 Divorced  Caucasian Fe here for annual exam. Periods normal,no issues. Not sexually active. Sees Dr. Rolland Bimler for aex, and labs. No health issues over the past year. No health concerns today. Daughter senior this coming year!!  Patient's last menstrual period was 12/14/2015.          Sexually active: No.  The current method of family planning is none.    Exercising: Yes.    Walking, Zumba Smoker:  no  Health Maintenance: Pap: 12/11/13 WNL MMG:  09/02/15-BIRADS1 Density C Colonoscopy:  2008  Family history of colon cancer repeat 10 years. BMD:   Never TDaP:  08/17/11 Shingles: Never Pneumonia: Never Hep C and HIV: Never Labs: PCP   reports that she has never smoked. She has never used smokeless tobacco. She reports that she does not drink alcohol or use illicit drugs.  Past Medical History  Diagnosis Date  . Migraine   . Sinus problem   . STD (sexually transmitted disease)     HSV 2  . Migraines     no aura    Past Surgical History  Procedure Laterality Date  . Cesarean section  2000  . Polypectomy  2010    Current Outpatient Prescriptions  Medication Sig Dispense Refill  . eletriptan (RELPAX) 40 MG tablet Take 1 tablet (40 mg total) by mouth as needed for migraine. May repeat in 2 hours if headache persists or recurs. (90 day Rx) 6 tablet 11  . loratadine (CLARITIN) 10 MG tablet Take 10 mg by mouth as needed for allergies.    Marland Kitchen topiramate (TOPAMAX) 25 MG tablet Take 2 tablets (50 mg total) by mouth 2 (two) times daily. 120 tablet 11   No current facility-administered medications for this visit.    Family History  Problem Relation Age of Onset  . Cancer - Colon Paternal Grandmother   . Heart disease Maternal Grandmother   . Hypertension Mother     ROS:  Pertinent items are noted in HPI.  Otherwise, a comprehensive ROS was negative.  Exam:   BP 100/62 mmHg  Pulse 76  Resp 16  Ht 5' 4.75" (1.645 m)  Wt 143 lb (64.864 kg)  BMI 23.97 kg/m2  LMP  12/14/2015 Height: 5' 4.75" (164.5 cm) Ht Readings from Last 3 Encounters:  12/21/15 5' 4.75" (1.645 m)  12/06/15 5\' 5"  (1.651 m)  12/17/14 5' 5.25" (1.657 m)    General appearance: alert, cooperative and appears stated age Head: Normocephalic, without obvious abnormality, atraumatic Neck: no adenopathy, supple, symmetrical, trachea midline and thyroid normal to inspection and palpation Lungs: clear to auscultation bilaterally Breasts: normal appearance, no masses or tenderness, No nipple retraction or dimpling, No nipple discharge or bleeding, No axillary or supraclavicular adenopathy Heart: regular rate and rhythm Abdomen: soft, non-tender; no masses,  no organomegaly Extremities: extremities normal, atraumatic, no cyanosis or edema Skin: Skin color, texture, turgor normal. No rashes or lesions Lymph nodes: Cervical, supraclavicular, and axillary nodes normal. No abnormal inguinal nodes palpated Neurologic: Grossly normal   Pelvic: External genitalia:  no lesions              Urethra:  normal appearing urethra with no masses, tenderness or lesions              Bartholin's and Skene's: normal                 Vagina: normal appearing vagina with normal color and discharge, no lesions  Cervix: multiparous appearance, no cervical motion tenderness and no lesions              Pap taken: Yes.   Bimanual Exam:  Uterus:  normal size, contour, position, consistency, mobility, non-tender              Adnexa: normal adnexa and no mass, fullness, tenderness               Rectovaginal: Confirms               Anus:  normal sphincter tone, no lesions  Chaperone present: yes  A:  Well Woman with normal exam  Contraception none needed, not sexually active  History of Migraine headache with MD management  P:   Reviewed health and wellness pertinent to exam  Continue follow up with PCP  Pap smear as above with HPVHR   counseled on breast self exam, mammography screening, adequate  intake of calcium and vitamin D, diet and exercise  return annually or prn  An After Visit Summary was printed and given to the patient.

## 2015-12-21 NOTE — Patient Instructions (Signed)

## 2015-12-22 NOTE — Progress Notes (Signed)
Reviewed personally.  M. Suzanne Shyonna Carlin, MD.  

## 2015-12-24 LAB — IPS PAP TEST WITH HPV

## 2016-08-23 ENCOUNTER — Other Ambulatory Visit: Payer: Self-pay | Admitting: Family Medicine

## 2016-08-23 DIAGNOSIS — Z1231 Encounter for screening mammogram for malignant neoplasm of breast: Secondary | ICD-10-CM

## 2016-09-07 ENCOUNTER — Ambulatory Visit
Admission: RE | Admit: 2016-09-07 | Discharge: 2016-09-07 | Disposition: A | Discharge status: Home / Self Care | Payer: Managed Care, Other (non HMO) | Source: Ambulatory Visit | Attending: Family Medicine | Admitting: Family Medicine

## 2016-09-07 DIAGNOSIS — Z1231 Encounter for screening mammogram for malignant neoplasm of breast: Secondary | ICD-10-CM

## 2016-12-06 ENCOUNTER — Ambulatory Visit (INDEPENDENT_AMBULATORY_CARE_PROVIDER_SITE_OTHER): Payer: 59 | Admitting: Nurse Practitioner

## 2016-12-06 ENCOUNTER — Encounter: Payer: Self-pay | Admitting: Nurse Practitioner

## 2016-12-06 ENCOUNTER — Encounter (INDEPENDENT_AMBULATORY_CARE_PROVIDER_SITE_OTHER): Payer: Self-pay

## 2016-12-06 VITALS — BP 112/72 | HR 76 | Ht 65.5 in | Wt 158.0 lb

## 2016-12-06 DIAGNOSIS — G43009 Migraine without aura, not intractable, without status migrainosus: Secondary | ICD-10-CM

## 2016-12-06 MED ORDER — TOPIRAMATE 25 MG PO TABS: 50.0000 mg | ORAL_TABLET | Freq: Two times a day (BID) | ORAL | 11 refills | Status: DC

## 2016-12-06 MED ORDER — ELETRIPTAN HYDROBROMIDE 40 MG PO TABS: 40.0000 mg | ORAL_TABLET | ORAL | 11 refills | Status: DC | PRN

## 2016-12-06 NOTE — Patient Instructions (Signed)
Continue Topamax 50mg  twice daily. will refill Continue Relpax at current dose will refill Call for an increase in headaches Follow-up yearly and when necessary

## 2016-12-06 NOTE — Progress Notes (Signed)
[facility]  PATIENT: Rachel Anthony DOB: 11-Jan-1966   REASON FOR VISIT: Follow-up for migraine HISTORY FROM: Patient    HISTORY OF PRESENT ILLNESS:Rachel Anthony, 51 year old female returns for yearly followup. She has history of migraines. .She has tried Depakote in the past but had side effects of significant tremor. She is currently on Topamax 50 mg twice daily tolerating the medication without side effects and her headache frequency is 1-2  headaches per month or less. She takes Relpax acutely with relief . Her headaches continue to be unilateral and frontotemporal and can be associated with nausea and vomiting, photophobia, and photosensitivity. She has not missed any work due to her headaches. 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 No new medical issues since last seen. She also has seasonal allergies. She returns for reevaluation   REVIEW OF SYSTEMS: Full 14 system review of systems performed and notable only for those listed, all others are neg:  Constitutional: neg  Cardiovascular: neg Ear/Nose/Throat: neg  Skin: neg Eyes: neg Respiratory: neg Gastroitestinal: neg  Hematology/Lymphatic: neg  Endocrine: neg Musculoskeletal:neg Allergy/Immunology: Seasonal Neurological: Headache  Psychiatric: neg Sleep : neg   ALLERGIES: No Known Allergies  HOME MEDICATIONS: Outpatient Medications Prior to Visit  Medication Sig Dispense Refill  . eletriptan (RELPAX) 40 MG tablet Take 1 tablet (40 mg total) by mouth as needed for migraine. May repeat in 2 hours if headache persists or recurs. (90 day Rx) 6 tablet 11  . loratadine (CLARITIN) 10 MG tablet Take 10 mg by mouth as needed for allergies.    Marland Kitchen topiramate (TOPAMAX) 25 MG tablet Take 2 tablets (50 mg total) by mouth 2 (two) times daily. 120 tablet 11   No facility-administered medications prior to visit.     PAST MEDICAL HISTORY: Past Medical History:  Diagnosis Date  . Migraine   . Migraines    no aura    . Sinus problem   . STD (sexually transmitted disease)    HSV 2    PAST SURGICAL HISTORY: Past Surgical History:  Procedure Laterality Date  . CESAREAN SECTION  2000  . POLYPECTOMY  2010    FAMILY HISTORY: Family History  Problem Relation Age of Onset  . Hypertension Mother   . Cancer - Colon Paternal Grandmother   . Heart disease Maternal Grandmother     SOCIAL HISTORY: Social History   Social History  . Marital status: Divorced    Spouse name: N/A  . Number of children: 1  . Years of education: 12+   Occupational History  .  [employer]   Social History Main Topics  . Smoking status: Never Smoker  . Smokeless tobacco: Never Used  . Alcohol use No  . Drug use: No  . Sexual activity: No   Other Topics Concern  . 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 The patient is divorced and lives with her daughter.      PHYSICAL EXAM  Vitals:   12/06/16 0734  BP: 112/72  Pulse: 76  Weight: 158 lb (71.7 kg)  Height: 5' 5.5" (1.664 m)   Body mass index is 25.89 kg/m. Generalized: Well developed, in no acute distress  Head: normocephalic and atraumatic,. Oropharynx benign  Neck: Supple,  Musculoskeletal: No deformity   Neurological examination   Mentation: Alert oriented to time, place, history taking. Follows all  commands speech and language fluent  Cranial nerve II-XII: Pupils were equal round reactive to light extraocular movements were full, visual field were full on confrontational test. Facial sensation and strength were normal. hearing was intact to finger rubbing bilaterally. Uvula tongue midline. head turning and shoulder shrug were normal and symmetric.Tongue protrusion into cheek strength was normal. Motor: normal bulk and tone, full strength in the BUE, BLE, fine finger movements normal, no pronator drift. No  focal weakness Coordination: finger-nose-finger, heel-to-shin bilaterally, no dysmetria Reflexes: Brachioradialis 2/2, biceps 2/2, triceps 2/2, patellar 2/2, Achilles 2/2, plantar responses were flexor bilaterally. Gait and Station: Rising up from seated position without assistance, normal stance, moderate stride, good arm swing, smooth turning, able to perform tiptoe, and heel walking without difficulty. Tandem gait is steady  DIAGNOSTIC DATA (LABS, IMAGING, TESTING)  ASSESSMENT AND PLAN 51 y.o. year old female has a past medical history of Migraine; here to follow-up. She has 1 to 2  headaches per month relieved with Relpax acutely , she is on Topamax as a preventive.  PLAN: Continue Topamax 50mg  twice daily. will refill Discussed tapering off of Topamax to see if her headaches are stable however she is not willing to do this at this time. But she also states she does not want to take the drug forever.  Continue Relpax at current dose will refill Call for an increase in headaches Follow-up yearly and when necessary I spent 15 min  in total face to face time with the patient more than 50% of which was spent counseling and coordination of care, reviewing test results reviewing medications and discussing and reviewing the diagnosis of migraine, avoiding migraine triggers and keeping a record of headaches if they  increase.  Dennie Bible, [facility], [facility], APRN  [facility] [address] [place], [place] [address] [phone]

## 2016-12-06 NOTE — Progress Notes (Signed)
I have read the note, and I agree with the clinical assessment and plan.  Rachel Anthony,Rachel Anthony   

## 2016-12-21 ENCOUNTER — Ambulatory Visit: Payer: 59 | Admitting: Certified Nurse Midwife

## 2017-01-04 ENCOUNTER — Encounter: Payer: Self-pay | Admitting: Certified Nurse Midwife

## 2017-01-04 ENCOUNTER — Ambulatory Visit (INDEPENDENT_AMBULATORY_CARE_PROVIDER_SITE_OTHER): Payer: 59 | Admitting: Certified Nurse Midwife

## 2017-01-04 VITALS — BP 100/60 | HR 68 | Resp 16 | Ht 64.75 in | Wt 160.0 lb

## 2017-01-04 DIAGNOSIS — Z8669 Personal history of other diseases of the nervous system and sense organs: Secondary | ICD-10-CM | POA: Diagnosis not present

## 2017-01-04 DIAGNOSIS — Z01419 Encounter for gynecological examination (general) (routine) without abnormal findings: Secondary | ICD-10-CM | POA: Diagnosis not present

## 2017-01-04 NOTE — Progress Notes (Signed)
51 y.o. G11P1001 Divorced  Caucasian Fe here for annual exam. Periods normal, no issues. Contraception not needed. No HSV 2 outbreaks. Sees PCP annually with labs and medication management for migraine headache, all stable. Not sexually active..No health issues today. Staying busy with Hospice work. Planning trip with family.   Patient's last menstrual period was 12/15/2016 (exact date).          Sexually active: No.  The current method of family planning is abstinence.    Exercising: Yes.    walking Smoker:  no  Health Maintenance: Pap:  12-11-13 neg, 12-21-15 neg HPV HR neg History of Abnormal Pap: no MMG:  09-07-16 category c density birads 1:neg Self Breast exams: yes Colonoscopy:  2008 f/u 21yrs BMD:   none TDaP:  2013 Shingles: no Pneumonia: no Hep C and HIV: HIV neg before Labs: none   reports that she has never smoked. She has never used smokeless tobacco. She reports that she does not drink alcohol or use drugs.  Past Medical History:  Diagnosis Date  . Migraine   . Migraines    no aura  . Sinus problem   . STD (sexually transmitted disease)    HSV 2    Past Surgical History:  Procedure Laterality Date  . CESAREAN SECTION  2000  . POLYPECTOMY  2010    Current Outpatient Prescriptions  Medication Sig Dispense Refill  . eletriptan (RELPAX) 40 MG tablet Take 1 tablet (40 mg total) by mouth as needed for migraine. May repeat in 2 hours if headache persists or recurs. (90 day Rx) 6 tablet 11  . loratadine (CLARITIN) 10 MG tablet Take 10 mg by mouth as needed for allergies.    Marland Kitchen topiramate (TOPAMAX) 25 MG tablet Take 2 tablets (50 mg total) by mouth 2 (two) times daily. 120 tablet 11   No current facility-administered medications for this visit.     Family History  Problem Relation Age of Onset  . Hypertension Mother   . Cancer - Colon Paternal Grandmother   . Heart disease Maternal Grandmother     ROS:  Pertinent items are noted in HPI.  Otherwise, a  comprehensive ROS was negative.  Exam:   BP 100/60   Pulse 68   Resp 16   Ht 5' 4.75" (1.645 m)   Wt 160 lb (72.6 kg)   LMP 12/15/2016 (Exact Date)   BMI 26.83 kg/m  Height: 5' 4.75" (164.5 cm) Ht Readings from Last 3 Encounters:  01/04/17 5' 4.75" (1.645 m)  12/06/16 5' 5.5" (1.664 m)  12/21/15 5' 4.75" (1.645 m)    General appearance: alert, cooperative and appears stated age Head: Normocephalic, without obvious abnormality, atraumatic Neck: no adenopathy, supple, symmetrical, trachea midline and thyroid normal to inspection and palpation Lungs: clear to auscultation bilaterally Breasts: normal appearance, no masses or tenderness, No nipple retraction or dimpling, No nipple discharge or bleeding, No axillary or supraclavicular adenopathy Heart: regular rate and rhythm Abdomen: soft, non-tender; no masses,  no organomegaly Extremities: extremities normal, atraumatic, no cyanosis or edema Skin: Skin color, texture, turgor normal. No rashes or lesions Lymph nodes: Cervical, supraclavicular, and axillary nodes normal. No abnormal inguinal nodes palpated Neurologic: Grossly normal   Pelvic: External genitalia:  no lesions              Urethra:  normal appearing urethra with no masses, tenderness or lesions              Bartholin's and Skene's: normal  Vagina: normal appearing vagina with normal color and discharge, no lesions              Cervix: multiparous appearance, no cervical motion tenderness and no lesions              Pap taken: No. Bimanual Exam:  Uterus:  normal size, contour, position, consistency, mobility, non-tender              Adnexa: normal adnexa and no mass, fullness, tenderness               Rectovaginal: Confirms               Anus:  normal sphincter tone, no lesions  Chaperone present: yes  A:  Well Woman with normal exam  Contraception none needed, not sexually active  Migraine headache history with PCP managment  P:   Reviewed  health and wellness pertinent to exam  Will advise if need changes  Aware of warning signs with migraine and will advise.  Pap smear: no   counseled on breast self exam, mammography screening, adequate intake of calcium and vitamin D, diet and exercise  return annually or prn  An After Visit Summary was printed and given to the patient.

## 2017-07-24 DIAGNOSIS — S61411A Laceration without foreign body of right hand, initial encounter: Secondary | ICD-10-CM | POA: Diagnosis not present

## 2017-07-24 DIAGNOSIS — S60221A Contusion of right hand, initial encounter: Secondary | ICD-10-CM | POA: Diagnosis not present

## 2017-07-25 ENCOUNTER — Telehealth: Payer: Self-pay | Admitting: *Deleted

## 2017-07-25 NOTE — Telephone Encounter (Signed)
Approval received this am for RELPAX from 07/25/2017 thru 07/25/2018 [employer].  PA# [employer] Value-Value Plus- SI PPO [id number] SS.    No phone #, fax is [phone].  Faxed to [facility] [phone] (confirmation received).

## 2017-07-25 NOTE — Telephone Encounter (Signed)
[place] and pt has new insurance.  Rachel Anthony [id number], [place] [id number] Group [id number]  Phone # [phone].  Cover [employer] [id number] initiated for Relpax BN approval. Pt has been on relpax since 2009 approx.  Has tried excedrin migraine, amitriptylline, maxalt samples, depakote ER (caused temor), topamax, and relpax (not work as well as BN).  Diagnosis G43.009.

## 2017-09-03 DIAGNOSIS — Z131 Encounter for screening for diabetes mellitus: Secondary | ICD-10-CM | POA: Diagnosis not present

## 2017-09-03 DIAGNOSIS — Z13220 Encounter for screening for lipoid disorders: Secondary | ICD-10-CM | POA: Diagnosis not present

## 2017-09-03 DIAGNOSIS — M79641 Pain in right hand: Secondary | ICD-10-CM | POA: Diagnosis not present

## 2017-09-03 DIAGNOSIS — Z0000 Encounter for general adult medical examination without abnormal findings: Secondary | ICD-10-CM | POA: Diagnosis not present

## 2017-09-03 DIAGNOSIS — Z1211 Encounter for screening for malignant neoplasm of colon: Secondary | ICD-10-CM | POA: Diagnosis not present

## 2017-09-05 DIAGNOSIS — Z1211 Encounter for screening for malignant neoplasm of colon: Secondary | ICD-10-CM | POA: Diagnosis not present

## 2017-09-06 ENCOUNTER — Other Ambulatory Visit: Payer: Self-pay | Admitting: Family Medicine

## 2017-09-06 DIAGNOSIS — Z1231 Encounter for screening mammogram for malignant neoplasm of breast: Secondary | ICD-10-CM

## 2017-09-18 DIAGNOSIS — M79644 Pain in right finger(s): Secondary | ICD-10-CM | POA: Diagnosis not present

## 2017-09-21 ENCOUNTER — Ambulatory Visit
Admission: RE | Admit: 2017-09-21 | Discharge: 2017-09-21 | Disposition: A | Discharge status: Home / Self Care | Payer: 59 | Source: Ambulatory Visit | Attending: Family Medicine | Admitting: Family Medicine

## 2017-09-21 DIAGNOSIS — Z1231 Encounter for screening mammogram for malignant neoplasm of breast: Secondary | ICD-10-CM

## 2017-09-25 DIAGNOSIS — M79644 Pain in right finger(s): Secondary | ICD-10-CM | POA: Diagnosis not present

## 2017-09-25 DIAGNOSIS — L988 Other specified disorders of the skin and subcutaneous tissue: Secondary | ICD-10-CM | POA: Diagnosis not present

## 2017-09-28 DIAGNOSIS — L988 Other specified disorders of the skin and subcutaneous tissue: Secondary | ICD-10-CM | POA: Insufficient documentation

## 2017-10-03 DIAGNOSIS — M79644 Pain in right finger(s): Secondary | ICD-10-CM | POA: Diagnosis not present

## 2017-10-03 DIAGNOSIS — L988 Other specified disorders of the skin and subcutaneous tissue: Secondary | ICD-10-CM | POA: Diagnosis not present

## 2017-10-22 DIAGNOSIS — M79644 Pain in right finger(s): Secondary | ICD-10-CM | POA: Diagnosis not present

## 2017-12-06 NOTE — Progress Notes (Signed)
[facility]  PATIENT: MERELIN HUMAN DOB: 01-17-1966   REASON FOR VISIT: Follow-up for migraine HISTORY FROM: Patient    HISTORY OF PRESENT ILLNESS:Ms. Fabio Neighbors, 52 year old female returns for yearly followup. She has history of migraines. .She has tried Depakote in the past but had side effects of significant tremor. She is currently on Topamax 50 mg twice daily tolerating the medication without side effects and her headache frequency is 1-2  headaches per month or less. She takes Relpax BRAND acutely with relief . Her headaches continue to be unilateral and frontotemporal and can be associated with nausea and vomiting, photophobia, and photosensitivity. She has not missed any work due to her headaches. 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 No new medical issues since last seen. She also has seasonal allergies that cause some of her headaches. She returns for reevaluation   REVIEW OF SYSTEMS: Full 14 system review of systems performed and notable only for those listed, all others are neg:  Constitutional: neg  Cardiovascular: neg Ear/Nose/Throat: neg  Skin: neg Eyes: neg Respiratory: neg Gastroitestinal: neg  Hematology/Lymphatic: neg  Endocrine: neg Musculoskeletal:neg Allergy/Immunology: Seasonal Neurological: Headache  Psychiatric: neg Sleep : neg   ALLERGIES: No Known Allergies  HOME MEDICATIONS: Outpatient Medications Prior to Visit  Medication Sig Dispense Refill  . eletriptan (RELPAX) 40 MG tablet Take 1 tablet (40 mg total) by mouth as needed for migraine. May repeat in 2 hours if headache persists or recurs. (90 day Rx) 6 tablet 11  . loratadine (CLARITIN) 10 MG tablet Take 10 mg by mouth as needed for allergies.    Marland Kitchen topiramate (TOPAMAX) 25 MG tablet Take 2 tablets (50 mg total) by mouth 2 (two) times daily. 120 tablet 11   No facility-administered medications prior to visit.     PAST MEDICAL HISTORY: Past Medical History:  Diagnosis Date    . Migraine   . Migraines    no aura  . Sinus problem   . STD (sexually transmitted disease)    HSV 2    PAST SURGICAL HISTORY: Past Surgical History:  Procedure Laterality Date  . CESAREAN SECTION  2000  . POLYPECTOMY  2010    FAMILY HISTORY: Family History  Problem Relation Age of Onset  . Hypertension Mother   . Cancer - Colon Paternal Grandmother   . Heart disease Maternal Grandmother     SOCIAL HISTORY: Social History   Socioeconomic History  . Marital status: Divorced    Spouse name: Not on file  . Number of children: 1  . Years of education: 12+  . Highest education level: Not on file  Occupational History    Employer: [employer]  Social Needs  . Financial resource strain: Not on file  . Food insecurity:    Worry: Not on file    Inability: Not on file  . Transportation needs:    Medical: Not on file    Non-medical: Not on file  Tobacco Use  . Smoking status: Never Smoker  . Smokeless tobacco: Never Used  Substance and Sexual Activity  . Alcohol use: No    Alcohol/week: 0.0 oz  . Drug use: No  . Sexual activity: Never    Partners: Male    Birth control/protection: Abstinence  Lifestyle  . Physical activity:    Days per week: Not on file    Minutes per session: Not on file  . Stress: Not on file  Relationships  . Social connections:  Talks on phone: Not on file    Gets together: Not on file    Attends religious service: Not on file    Active member of club or organization: Not on file    Attends meetings of clubs or organizations: Not on file    Relationship status: Not on file  . Intimate partner violence:    Fear of current or ex partner: Not on file    Emotionally abused: Not on file    Physically abused: Not on file    Forced sexual activity: Not on file  Other Topics Concern  . Not on file  Social History Narrative   Patient works as a Education officer, museum at [employer] for 13 years and  has her masters degree. The patient is divorced and lives with her daughter.      PHYSICAL EXAM  Vitals:   12/07/17 0746  BP: 119/79  Pulse: 83  Weight: 159 lb 12.8 oz (72.5 kg)  Height: 5' 4.75" (1.645 m)   Body mass index is 26.8 kg/m. Generalized: Well developed, in no acute distress  Head: normocephalic and atraumatic,. Oropharynx benign  Neck: Supple,  Musculoskeletal: No deformity   Neurological examination   Mentation: Alert oriented to time, place, history taking. Follows all commands speech and language fluent  Cranial nerve II-XII: Pupils were equal round reactive to light extraocular movements were full, visual field were full on confrontational test. Facial sensation and strength were normal. hearing was intact to finger rubbing bilaterally. Uvula tongue midline. head turning and shoulder shrug were normal and symmetric.Tongue protrusion into cheek strength was normal. Motor: normal bulk and tone, full strength in the BUE, BLE, fine finger movements normal, no pronator drift. No focal weakness Coordination: finger-nose-finger, heel-to-shin bilaterally, no dysmetria Reflexes: Brachioradialis 2/2, biceps 2/2, triceps 2/2, patellar 2/2, Achilles 2/2, plantar responses were flexor bilaterally. Gait and Station: Rising up from seated position without assistance, normal stance, moderate stride, good arm swing, smooth turning, able to perform tiptoe, and heel walking without difficulty. Tandem gait is steady  DIAGNOSTIC DATA (LABS, IMAGING, TESTING)  ASSESSMENT AND PLAN 52 y.o. year old female has a past medical history of Migraine; here to follow-up. She has 1 to 2  headaches per month relieved with Relpax BRAND acutely , she is on Topamax as a preventive.  PLAN: Continue Topamax 50mg  twice daily. will refill Discussed tapering off of Topamax to see if her headaches are stable however she is not willing to do this at this time. But she also states she does not want to  take the drug forever.  Continue Relpax at current dose will refill Call for an increase in headaches Follow-up yearly and when necessary I spent 15 min  in total face to face time with the patient more than 50% of which was spent counseling and coordination of care, reviewing test results reviewing medications and discussing and reviewing the diagnosis of migraine, avoiding migraine triggers and keeping a record of headaches if they  increase.  Dennie Bible, [facility], [facility], APRN  [facility] [address] [place], [place] [address] [phone]

## 2017-12-07 ENCOUNTER — Encounter: Payer: Self-pay | Admitting: Nurse Practitioner

## 2017-12-07 ENCOUNTER — Ambulatory Visit (INDEPENDENT_AMBULATORY_CARE_PROVIDER_SITE_OTHER): Payer: 59 | Admitting: Nurse Practitioner

## 2017-12-07 VITALS — BP 119/79 | HR 83 | Ht 64.75 in | Wt 159.8 lb

## 2017-12-07 DIAGNOSIS — G43009 Migraine without aura, not intractable, without status migrainosus: Secondary | ICD-10-CM

## 2017-12-07 MED ORDER — TOPIRAMATE 25 MG PO TABS: 50.0000 mg | ORAL_TABLET | Freq: Two times a day (BID) | ORAL | 11 refills | Status: DC

## 2017-12-07 MED ORDER — RELPAX 40 MG PO TABS: 40.0000 mg | ORAL_TABLET | ORAL | 11 refills | Status: DC | PRN

## 2017-12-07 NOTE — Patient Instructions (Signed)
Continue Topamax 50mg  twice daily. will refill Discussed tapering off of Topamax to see if her headaches are stable however she is not willing to do this at this time. But she also states she does not want to take the drug forever.  Continue Relpax at current dose will refill Call for an increase in headaches Follow-up yearly and when necessary

## 2017-12-07 NOTE — Progress Notes (Signed)
I have read the note, and I agree with the clinical assessment and plan.  Virginia Curl K Kista Robb   

## 2018-01-10 ENCOUNTER — Ambulatory Visit (INDEPENDENT_AMBULATORY_CARE_PROVIDER_SITE_OTHER): Payer: 59 | Admitting: Certified Nurse Midwife

## 2018-01-10 ENCOUNTER — Other Ambulatory Visit: Payer: Self-pay

## 2018-01-10 ENCOUNTER — Encounter: Payer: Self-pay | Admitting: Certified Nurse Midwife

## 2018-01-10 VITALS — BP 110/64 | HR 64 | Resp 16 | Ht 64.75 in | Wt 163.0 lb

## 2018-01-10 DIAGNOSIS — N921 Excessive and frequent menstruation with irregular cycle: Secondary | ICD-10-CM | POA: Diagnosis not present

## 2018-01-10 DIAGNOSIS — N951 Menopausal and female climacteric states: Secondary | ICD-10-CM

## 2018-01-10 DIAGNOSIS — N912 Amenorrhea, unspecified: Secondary | ICD-10-CM

## 2018-01-10 DIAGNOSIS — Z01411 Encounter for gynecological examination (general) (routine) with abnormal findings: Secondary | ICD-10-CM

## 2018-01-10 DIAGNOSIS — N946 Dysmenorrhea, unspecified: Secondary | ICD-10-CM

## 2018-01-10 DIAGNOSIS — Z3202 Encounter for pregnancy test, result negative: Secondary | ICD-10-CM | POA: Diagnosis not present

## 2018-01-10 DIAGNOSIS — N852 Hypertrophy of uterus: Secondary | ICD-10-CM | POA: Diagnosis not present

## 2018-01-10 LAB — POCT URINE PREGNANCY: PREG TEST UR: NEGATIVE

## 2018-01-10 NOTE — Progress Notes (Signed)
52 y.o. G25P1001 Divorced  Caucasian Fe here for annual exam. Periods normal until 10/19. Having more cramping now,and she has having to take Pamprin for relief.. Has used occasional in past with good relief  Also has nausea with onset period ( had as a teenager) Occurring from 10/18 to 12/18 had cramping and heavier cycle.. No periods 1/19, 2/19. March and April (nausea and vomiting) with period and  LMP lasted longer. No period since May. Not sexually active.. Having hot flashes at times and occasional night sweats. Questions if  Menopause related. Sees Dr. Orland Mustard yearly for medication management of migraine, no change in occurrence. No other health issues today. 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  Patient's last menstrual period was 11/25/2017 (exact date).          Sexually active: No.  The current method of family planning is abstinence.    Exercising: Yes.    walking Smoker:  no  Health Maintenance: Pap:  12-21-15 neg HPV HR neg History of Abnormal Pap: no MMG:  09-21-17 category c density birads 1:neg Self Breast exams: yes Colonoscopy:  2009 f/u 66yrs declines scheduling at this time BMD:   none TDaP:  2013 Shingles: not done Pneumonia: not done Hep C and HIV: neg yrs ago Labs: upt-neg   reports that she has never smoked. She has never used smokeless tobacco. She reports that she does not drink alcohol or use drugs.  Past Medical History:  Diagnosis Date  . Migraine   . Migraines    no aura  . Sinus problem   . STD (sexually transmitted disease)    HSV 2    Past Surgical History:  Procedure Laterality Date  . CESAREAN SECTION  2000  . POLYPECTOMY  2010    Current Outpatient Medications  Medication Sig Dispense Refill  . loratadine (CLARITIN) 10 MG tablet Take 10 mg by mouth as needed for allergies.    . RELPAX 40 MG tablet Take 1 tablet (40 mg total) by mouth as needed for migraine. May repeat in 2 hours if headache persists or recurs. (90 day Rx) 6 tablet  11  . topiramate (TOPAMAX) 25 MG tablet Take 2 tablets (50 mg total) by mouth 2 (two) times daily. 120 tablet 11   No current facility-administered medications for this visit.     Family History  Problem Relation Age of Onset  . Hypertension Mother   . Cancer - Colon Paternal Grandmother   . Heart disease Maternal Grandmother     ROS:  Pertinent items are noted in HPI.  Otherwise, a comprehensive ROS was negative.  Exam:   BP 110/64   Pulse 64   Resp 16   Ht 5' 4.75" (1.645 m)   Wt 163 lb (73.9 kg)   LMP 11/25/2017 (Exact Date)   BMI 27.33 kg/m  Height: 5' 4.75" (164.5 cm) Ht Readings from Last 3 Encounters:  01/10/18 5' 4.75" (1.645 m)  12/07/17 5' 4.75" (1.645 m)  01/04/17 5' 4.75" (1.645 m)    General appearance: alert, cooperative and appears stated age Head: Normocephalic, without obvious abnormality, atraumatic Neck: no adenopathy, supple, symmetrical, trachea midline and thyroid normal to inspection and palpation Lungs: clear to auscultation bilaterally Breasts: normal appearance, no masses or tenderness, No nipple retraction or dimpling, No nipple discharge or bleeding, No axillary or supraclavicular adenopathy Heart: regular rate and rhythm Abdomen: soft, non-tender; no masses,  no organomegaly Extremities: extremities normal, atraumatic, no cyanosis or edema Skin: Skin color, texture,  turgor normal. No rashes or lesions Lymph nodes: Cervical, supraclavicular, and axillary nodes normal. No abnormal inguinal nodes palpated Neurologic: Grossly normal   Pelvic: External genitalia:  no lesions              Urethra:  normal appearing urethra with no masses, tenderness or lesions              Bartholin's and Skene's: normal                 Vagina: normal appearing vagina with normal color and discharge, no lesions              Cervix: no cervical motion tenderness, no lesions and normal appearance              Pap taken: No. Bimanual Exam:  Uterus:  enlarged,  12-14 weeks size tilts to left, ?adnexal mass              Adnexa: normal adnexa and no mass, fullness, tenderness on right , unable to feel on left               Rectovaginal: Confirms               Anus:  normal sphincter tone, no lesions  Chaperone present: yes  A:  Well Woman with normal exam  Contraception none needed, not sexually active  Menstrual cycle change with Dysmenorrhea and menorrhagia Amenorrhea with negative UPT  Enlarged uterus ? Adnexal mass on left  Perimenopausal  History of migraine headaches with MD management  Colonoscopy due, declines scheduling at this point. Aware recheck is due.    P:   Reviewed health and wellness pertinent to exam  Discussed enlarged uterus finding and possible etiology of fibroids, adenomyosis, or mass. Unable to feel left adnexal due to change. Discussed PUS for evaluation. Patient agreeable. Warning signs of excessive bleeding given.  Patient will be called with insurance information and scheduled. Her results will be discussed at her visit.  Discussed menopause/perimenopause, expectations with bleeding and symptoms. Printed information given. Discussed since no period would recommend labs to evaluated. Agreeable.  Labs FSH, TSH, Prolactin  Continue follow up with MD regarding migraine medication.  Pap smear: no   counseled on breast self exam, mammography screening, menopause, adequate intake of calcium and vitamin D, diet and exercise  return annually or prn  An After Visit Summary was printed and given to the patient.

## 2018-01-10 NOTE — Patient Instructions (Signed)
EXERCISE AND DIET:  We recommended that you start or continue a regular exercise program for good health. Regular exercise means any activity that makes your heart beat faster and makes you sweat.  We recommend exercising at least 30 minutes per day at least 3 days a week, preferably 4 or 5.  We also recommend a diet low in fat and sugar.  Inactivity, poor dietary choices and obesity can cause diabetes, heart attack, stroke, and kidney damage, among others.    ALCOHOL AND SMOKING:  Women should limit their alcohol intake to no more than 7 drinks/beers/glasses of wine (combined, not each!) per week. Moderation of alcohol intake to this level decreases your risk of breast cancer and liver damage. And of course, no recreational drugs are part of a healthy lifestyle.  And absolutely no smoking or even second hand smoke. Most people know smoking can cause heart and lung diseases, but did you know it also contributes to weakening of your bones? Aging of your skin?  Yellowing of your teeth and nails?  CALCIUM AND VITAMIN D:  Adequate intake of calcium and Vitamin D are recommended.  The recommendations for exact amounts of these supplements seem to change often, but generally speaking 600 mg of calcium (either carbonate or citrate) and 800 units of Vitamin D per day seems prudent. Certain women may benefit from higher intake of Vitamin D.  If you are among these women, your doctor will have told you during your visit.    PAP SMEARS:  Pap smears, to check for cervical cancer or precancers,  have traditionally been done yearly, although recent scientific advances have shown that most women can have pap smears less often.  However, every woman still should have a physical exam from her gynecologist every year. It will include a breast check, inspection of the vulva and vagina to check for abnormal growths or skin changes, a visual exam of the cervix, and then an exam to evaluate the size and shape of the uterus and  ovaries.  And after 51 years of age, a rectal exam is indicated to check for rectal cancers. We will also provide age appropriate advice regarding health maintenance, like when you should have certain vaccines, screening for sexually transmitted diseases, bone density testing, colonoscopy, mammograms, etc.   MAMMOGRAMS:  All women over 40 years old should have a yearly mammogram. Many facilities now offer a "3D" mammogram, which may cost around $50 extra out of pocket. If possible,  we recommend you accept the option to have the 3D mammogram performed.  It both reduces the number of women who will be called back for extra views which then turn out to be normal, and it is better than the routine mammogram at detecting truly abnormal areas.    COLONOSCOPY:  Colonoscopy to screen for colon cancer is recommended for all women at age 50.  We know, you hate the idea of the prep.  We agree, BUT, having colon cancer and not knowing it is worse!!  Colon cancer so often starts as a polyp that can be seen and removed at colonscopy, which can quite literally save your life!  And if your first colonoscopy is normal and you have no family history of colon cancer, most women don't have to have it again for 10 years.  Once every ten years, you can do something that may end up saving your life, right?  We will be happy to help you get it scheduled when you are ready.    Be sure to check your insurance coverage so you understand how much it will cost.  It may be covered as a preventative service at no cost, but you should check your particular policy.      Uterine Fibroids Uterine fibroids are tissue masses (tumors). They are also called leiomyomas. They can develop inside of a woman's womb (uterus). They can grow very large. Fibroids are not cancerous (benign). Most fibroids do not require medical treatment. Follow these instructions at home:  Keep all follow-up visits as told by your doctor. This is important.  Take  medicines only as told by your doctor. ? If you were prescribed a hormone treatment, take the hormone medicines exactly as told. ? Do not take aspirin. It can cause bleeding.  Ask your doctor about taking iron pills and increasing the amount of dark green, leafy vegetables in your diet. These actions can help to boost your blood iron levels.  Pay close attention to your period. Tell your doctor about any changes, such as: ? Increased blood flow. This may require you to use more pads or tampons than usual per month. ? A change in the number of days that your period lasts per month. ? A change in symptoms that come with your period, such as back pain or cramping in your belly area (abdomen). Contact a doctor if:  You have pain in your back or the area between your hip bones (pelvic area) that is not controlled by medicines.  You have pain in your abdomen that is not controlled with medicines.  You have an increase in bleeding between and during periods.  You soak tampons or pads in a half hour or less.  You feel lightheaded.  You feel extra tired.  You feel weak. Get help right away if:  You pass out (faint).  You have a sudden increase in pelvic pain. This information is not intended to replace advice given to you by your health care provider. Make sure you discuss any questions you have with your health care provider. Document Released: 07/22/2010 Document Revised: 02/18/2016 Document Reviewed: 12/16/2013 Elsevier Interactive Patient Education  [employer].

## 2018-01-11 LAB — PROLACTIN: Prolactin: 9.7 ng/mL (ref 4.8–23.3)

## 2018-01-11 LAB — TSH: TSH: 2.11 u[IU]/mL (ref 0.450–4.500)

## 2018-01-11 LAB — FOLLICLE STIMULATING HORMONE: FSH: 74.7 m[IU]/mL

## 2018-01-14 ENCOUNTER — Telehealth: Payer: Self-pay | Admitting: Certified Nurse Midwife

## 2018-01-14 NOTE — Telephone Encounter (Signed)
Call placed to patient to review benefits for a recommended ultraosund. Left voicemail message requesting a return call.

## 2018-01-15 ENCOUNTER — Other Ambulatory Visit: Payer: Self-pay | Admitting: *Deleted

## 2018-01-15 DIAGNOSIS — N946 Dysmenorrhea, unspecified: Secondary | ICD-10-CM

## 2018-01-15 DIAGNOSIS — N921 Excessive and frequent menstruation with irregular cycle: Secondary | ICD-10-CM

## 2018-01-15 DIAGNOSIS — N852 Hypertrophy of uterus: Secondary | ICD-10-CM

## 2018-01-15 DIAGNOSIS — N912 Amenorrhea, unspecified: Secondary | ICD-10-CM

## 2018-01-15 NOTE — Telephone Encounter (Signed)
Patient returned call.Spoke with pt regarding benefit for ultrasound. Patient understood and agreeable. Patient ready to schedule. Patient scheduled 01/24/18 with Dr Sabra Heck. Patient aware of appointment date, arrival time and  cancellation policy.  No further questions. Ok to close

## 2018-01-24 ENCOUNTER — Ambulatory Visit (INDEPENDENT_AMBULATORY_CARE_PROVIDER_SITE_OTHER): Payer: 59 | Admitting: Obstetrics & Gynecology

## 2018-01-24 ENCOUNTER — Ambulatory Visit (INDEPENDENT_AMBULATORY_CARE_PROVIDER_SITE_OTHER): Payer: 59

## 2018-01-24 VITALS — BP 136/86 | HR 68 | Resp 16 | Ht 64.75 in | Wt 156.0 lb

## 2018-01-24 DIAGNOSIS — N912 Amenorrhea, unspecified: Secondary | ICD-10-CM | POA: Diagnosis not present

## 2018-01-24 DIAGNOSIS — N852 Hypertrophy of uterus: Secondary | ICD-10-CM | POA: Diagnosis not present

## 2018-01-24 DIAGNOSIS — N838 Other noninflammatory disorders of ovary, fallopian tube and broad ligament: Secondary | ICD-10-CM | POA: Diagnosis not present

## 2018-01-24 DIAGNOSIS — N83202 Unspecified ovarian cyst, left side: Secondary | ICD-10-CM | POA: Diagnosis not present

## 2018-01-24 DIAGNOSIS — N921 Excessive and frequent menstruation with irregular cycle: Secondary | ICD-10-CM | POA: Diagnosis not present

## 2018-01-24 DIAGNOSIS — N946 Dysmenorrhea, unspecified: Secondary | ICD-10-CM

## 2018-01-24 NOTE — Progress Notes (Signed)
52 y.o. G103P1001 Divorced Caucasian female here for pelvic ultrasound due to increased pelvic cramping as well as change in bleeding over the past several months.  Flow is now heavier and more irregular.  On physical exam, CNM, Kemba Hoppes, felt a possible adnexal mass as well.  Ultrasound is to evaluate all of the above.    TSH was normal, prolactin was normal, and FSH was 74 with testing on 01/10/18.  Patient's last menstrual period was 11/25/2017.  Contraception: abstinence   Findings:  UTERUS: 9.2 x 5.8 x 4.1 cm with 2 fibroids measuring 1.7 cm and 1.3 cm.  These appear submucosal. EMS: 7.2 mm ADNEXA: Left ovary: 4.3 x 1.8 x 3.1 cm with 4.3 x 2.9 x 3.3 cm thin-walled, avascular, echo-free cyst as well as a 13 mm follicle       Right ovary: 2.4 x 0.9 x 1.0 cm with 1.5 x 1.3 cm probable para tubal cyst.  This is separate from the ovary. CUL DE SAC: No free fluid in the posterior cul-de-sac noted  Discussion: Findings reviewed with the patient.  Despite her [facility] of 74 in July, it does look on ultrasound like she is still producing some estrogen.  Her endometrium is consistent with this as well.  As she has only been 2 months since her last cycle, I think it is okay to monitor her and not proceed with endometrial biopsy today.  I suspect she will have some occasional cycles in the future.  I have recommended repeating an ultrasound to watch and see what that the ovary on the left has a functional cyst or not.  If the cyst is not resolved on the next ultrasound, I would recommend consideration of removal.  I could remove the paratubal cyst on the right side at the same time if necessary.  D&C could also be done simultaneously if indicated.  Assessment: Fibroid uterus with 2 fibroids measuring 1.7 and 1.3 cm Left ovary with 4.3 x 2.9 x 3.3 cm thin-walled cyst Right ovary with 1.5 cm para tubal cyst Irregular bleeding  Plan: Repeat ultrasound in 8 weeks.  If patient has not had bleeding by that  time, I would repeat her [facility] and consider Provera withdrawal and/or endometrial biopsy.  Patient is to call with any new complaints or concerns.  All questions answered.  ~20 minutes spent with patient >50% of time was in face to face discussion of above.   CC:  Melvia Heaps

## 2018-01-28 ENCOUNTER — Encounter: Payer: Self-pay | Admitting: Obstetrics & Gynecology

## 2018-03-13 ENCOUNTER — Telehealth: Payer: Self-pay | Admitting: Nurse Practitioner

## 2018-03-13 NOTE — Telephone Encounter (Signed)
Spoke with patient to discuss. She stated that she has spoken with a 3rd party and will be reimbursed for the Relpax she took that was in recalled lot. She asked about getting a replacement for those pills. This RN advised this office doesn't disburse pills. She stated she knew that but has been told to call this office. This RN asked if she spoke with [facility]; again she stated they told her to call this office. Patient insisted that she needed those 6 pills to be replaced. She stated some months she actually uses more than 6 from leftover pills she didn't have to take in previous months. She is concerned she may run out of Relpax during months that she has more migraines and takes more than 6. The patient has just picked up a refill of 6 Relpax. This RN suggested she see how she does in the next 2-3 weeks and call if she runs out of Relpax. Suggested that maybe she needs her Rx to be increased, however when she saw NP in June she reported 1-2 migraines or less/monthly. This RN asked if she is now having more migraines, and she denied but stated some months she does. This RN advised her that NP is out of office until next Monday. This RN will send message to NP to address Monday or can send to on call dr. Patient stated she will wait for NP to address Monday.

## 2018-03-13 NOTE — Telephone Encounter (Signed)
Pt requesting a call to discuss the recall for RELPAX 40 MG tablet stating she has gotten a new prescription but has 6 tablets left over from last month, would like to have a pill replacement for the left over tablets. Please advise

## 2018-03-18 NOTE — Telephone Encounter (Signed)
What is dispensed is based on what her insurance will cover.

## 2018-03-18 NOTE — Telephone Encounter (Signed)
LVM for patient advising her that medication is filled per insurance coverage. Advised she call her insurance to find out maximum # tabs of Relpax they will cover per month. Then she can call this RN to inform. Left office number.

## 2018-03-21 DIAGNOSIS — Z23 Encounter for immunization: Secondary | ICD-10-CM | POA: Diagnosis not present

## 2018-03-28 ENCOUNTER — Ambulatory Visit (INDEPENDENT_AMBULATORY_CARE_PROVIDER_SITE_OTHER): Payer: 59 | Admitting: Obstetrics & Gynecology

## 2018-03-28 ENCOUNTER — Ambulatory Visit (INDEPENDENT_AMBULATORY_CARE_PROVIDER_SITE_OTHER): Payer: 59

## 2018-03-28 ENCOUNTER — Encounter: Payer: Self-pay | Admitting: Obstetrics & Gynecology

## 2018-03-28 ENCOUNTER — Other Ambulatory Visit: Payer: Self-pay | Admitting: Obstetrics & Gynecology

## 2018-03-28 VITALS — BP 110/80 | HR 92 | Resp 14 | Ht 64.75 in | Wt 171.4 lb

## 2018-03-28 DIAGNOSIS — D252 Subserosal leiomyoma of uterus: Secondary | ICD-10-CM

## 2018-03-28 DIAGNOSIS — D251 Intramural leiomyoma of uterus: Secondary | ICD-10-CM

## 2018-03-28 DIAGNOSIS — N838 Other noninflammatory disorders of ovary, fallopian tube and broad ligament: Secondary | ICD-10-CM | POA: Diagnosis not present

## 2018-03-28 DIAGNOSIS — N852 Hypertrophy of uterus: Secondary | ICD-10-CM

## 2018-03-28 DIAGNOSIS — N83202 Unspecified ovarian cyst, left side: Secondary | ICD-10-CM | POA: Diagnosis not present

## 2018-03-28 NOTE — Progress Notes (Signed)
52 y.o. G51P1001 Divorced White or Caucasian female here for pelvic ultrasound due to recheck left ovary and right paratubal cyst.  Pt has been experiencing increasing pelvic pressure and possible adnexal mass was noted on physical exam.    Ultrasound on 01/24/18 showed left ovary with a 4.3 x 2.9 x 3.3cm thin-walled, avascular, echo free cyst.  The right adnexa appears to also have a 1.5 x 1.3cm paratubal cyst.  She does have two 1.7cm and 1.3cm fibroids.  Pt and I discussed follow up ultrasound.  It has been two months since the prior ultrasound.    Patient's last menstrual period was 03/15/2018.  Contraception: abstinence   Findings:  UTERUS: 8.4 x 6.0 x 4.0cm with two fibroids as noted previouslymeasuring 1.6 and 2.1cm today EMS: 4.41mm ADNEXA: Left ovary: cystic finding is slightly larger than previously ultrasound measuring 4.6 x 3.7 x 3.2cm.       Right ovary: 2.6 x 1.0 x 1.6cm with probable paratubal cyst that is larger today as well measuring 2.1 x 1.4cm CUL DE SAC: no free fluid  Discussion:  Findings reviewed with pt.  As left adnexal cyst is enlarging, feel this should be removed as it is likely a cystadenoma.  Continued pain and possible ovarian torison risks were discussed if pt decides to follow conservatively, which is also an option.    Procedure discussed with patient.  Hospital stay, recovery and pain management all discussed.  Risks discussed including but not limited to bleeding, rare risk of receiving a  transfusion, infection, <1% risk of bowel/bladder/ureteral/vascular injury discussed as well as possible need for additional surgery if injury does occur discussed.  DVT/PE and rare risk of death discussed.  My actual complications with prior surgeries discussed.  Positioning and incision locations discussed.  Patient aware if pathology abnormal she may need additional treatment.  All questions answered.    Assessment:  Enlarging, 4.5cm left adnexal cyst with right paratubal  cyst, pelvic pressure, fibroids  Plan:  Pt desires removal but may want to postpone until after Thanksgiving.  Will plan laparoscopic RSO and left salpingectomy.  Would recommend repeat ultrasound prior to surgery if that were the case.  Will send information for scheduling.   [place] bulletin on laparoscopy also provided to pt.  ~25 minutes spent with patient >50% of time was in face to face discussion of above.

## 2018-04-01 ENCOUNTER — Telehealth: Payer: Self-pay | Admitting: Obstetrics & Gynecology

## 2018-04-01 NOTE — Telephone Encounter (Signed)
Call placed to reviewed benefits for surgery. Left voicemail message requesting a return call.   Lamont Snowball, RN

## 2018-04-16 NOTE — Telephone Encounter (Signed)
Second call placed to patient to review benefits for recommended surgery. Left voicemail message requesting a return call   cc: Lamont Snowball, RN

## 2018-04-25 NOTE — Telephone Encounter (Signed)
Patient returned call. Spoke with patient regarding benefit for surgery. Patient understood and agreeable. Patient has confirmed and is ready to schedule. Patient aware this is professional benefit only. Patient aware will be contacted by hospital for separate benefits. Forwarding to Conservation officer, historic buildings for scheduling   Routing to Lamont Snowball, RN

## 2018-04-25 NOTE — Telephone Encounter (Signed)
Third call placed to patient to review benefits for recommended surgery. Left voicemail message requesting a return call.   cc: Lamont Snowball, RN

## 2018-04-30 NOTE — Telephone Encounter (Signed)
Call to patient. Per Galen Manila, can leave message on voice mail which has first and last name confirmation. Left message calling to assist with surgery scheduling. Available date options left on voice mail. Left message to call back.

## 2018-05-03 ENCOUNTER — Telehealth: Payer: Self-pay | Admitting: Certified Nurse Midwife

## 2018-05-03 NOTE — Telephone Encounter (Signed)
Call to patient. Per ROI, can leave message on voice mail which has first and last name confirmation.  Left message calling to assist with procedure scheduling.  Advised date options will fill quickly, especially toward end of year. Requested call back with date preferences.

## 2018-05-03 NOTE — Telephone Encounter (Signed)
Return call from patient. She states she would like to wait till week of 06-24-18 to proceed with surgery. She will have this week off for recovery. Advised will schedule and confirm and call back.

## 2018-05-09 NOTE — Telephone Encounter (Signed)
Detailed message left per DPR advising patient to return call to review Surgery Information Form and schedule pre and post op appointments.

## 2018-05-14 NOTE — Telephone Encounter (Signed)
Detailed message left per DPR advising patient to return call to review Surgery Information Sheet and schedule pre and post op appointments. Advised could ask to speak to Rachel Anthony.

## 2018-05-15 NOTE — Telephone Encounter (Signed)
Detailed message left per DPR advising patient of need to schedule PUS and pre op ~ 3 weeks prior to surgery. Advised could return call to [place] or Gay Filler.

## 2018-05-16 NOTE — Telephone Encounter (Signed)
Patient returned call. Surgery date of 06-24-18 at 0730 at [facility] confirmed. Surgery instruction sheet reviewed and printed copy will be mailed.   Routing to Dr Wallis Mart. Encounter closed.

## 2018-05-17 ENCOUNTER — Other Ambulatory Visit: Payer: Self-pay | Admitting: Obstetrics & Gynecology

## 2018-05-23 ENCOUNTER — Telehealth: Payer: Self-pay | Admitting: Obstetrics & Gynecology

## 2018-05-23 DIAGNOSIS — N838 Other noninflammatory disorders of ovary, fallopian tube and broad ligament: Secondary | ICD-10-CM

## 2018-05-23 DIAGNOSIS — N83202 Unspecified ovarian cyst, left side: Secondary | ICD-10-CM

## 2018-05-23 NOTE — Telephone Encounter (Signed)
Patient returned call. She needs to reschedule from 06/06/18 due to presentation she will need to give.  Rescheduled to 06/13/18 for Pelvic ultrasound and pre op with Dr. Sabra Heck.  enocunter closed.

## 2018-05-23 NOTE — Telephone Encounter (Signed)
Patient states she needs to reschedule a pre-op appointment.

## 2018-05-23 NOTE — Telephone Encounter (Signed)
Message left to return call to Rachel Anthony at [phone].    

## 2018-05-27 ENCOUNTER — Telehealth: Payer: Self-pay | Admitting: Obstetrics & Gynecology

## 2018-05-27 NOTE — Telephone Encounter (Signed)
Call placed to patient to review benefits for a scheduled ultrasound appointment. Left voicemail message requesting a return call.

## 2018-06-03 ENCOUNTER — Encounter: Payer: Self-pay | Admitting: Obstetrics & Gynecology

## 2018-06-06 ENCOUNTER — Other Ambulatory Visit: Payer: 59

## 2018-06-06 ENCOUNTER — Other Ambulatory Visit: Payer: 59 | Admitting: Obstetrics & Gynecology

## 2018-06-13 ENCOUNTER — Encounter: Payer: Self-pay | Admitting: Obstetrics & Gynecology

## 2018-06-13 ENCOUNTER — Ambulatory Visit (INDEPENDENT_AMBULATORY_CARE_PROVIDER_SITE_OTHER): Payer: 59

## 2018-06-13 ENCOUNTER — Other Ambulatory Visit: Payer: Self-pay

## 2018-06-13 ENCOUNTER — Ambulatory Visit (INDEPENDENT_AMBULATORY_CARE_PROVIDER_SITE_OTHER): Payer: 59 | Admitting: Obstetrics & Gynecology

## 2018-06-13 ENCOUNTER — Other Ambulatory Visit: Payer: 59

## 2018-06-13 VITALS — BP 118/62 | HR 72 | Resp 16 | Ht 64.5 in | Wt 176.6 lb

## 2018-06-13 DIAGNOSIS — N83202 Unspecified ovarian cyst, left side: Secondary | ICD-10-CM

## 2018-06-13 DIAGNOSIS — N838 Other noninflammatory disorders of ovary, fallopian tube and broad ligament: Secondary | ICD-10-CM

## 2018-06-13 DIAGNOSIS — D252 Subserosal leiomyoma of uterus: Secondary | ICD-10-CM | POA: Diagnosis not present

## 2018-06-13 DIAGNOSIS — D251 Intramural leiomyoma of uterus: Secondary | ICD-10-CM

## 2018-06-13 NOTE — Progress Notes (Signed)
52 y.o. G41P1001 Divorced White or Caucasian female here for discussion of upcoming procedure.  Last visit was in September.  At that time, pt desired to wait for procedure.  She is here for interval update of history and repeat PUS to ensure there are no changes at this time.  Prior ultrasound has shown a right paratubal cyst, two fibroids <2.0 cm each, and ~4.5cm simple ovarian cyst.  She has continued to have some intermittent pelvic pressure.  Stillhaving menstrual cycles but they are a little irregular at times.   Ultrasound today showed:  Uterus:  9.4 x 6.2 x 4.4cm with  Endometrial thickness:  2.62mm Left ovary is 2.9 x 1.0 x 3.3cm with 4.2 x 3.4 x 2.9cm simple avascular cyst with scattered internal debris.   Right ovary is 1.7 x 1.2 x 6.1YW with a 63mm follicles.  Otherwise, right side is normal.  Procedure reviewed.  She is going to have the left ovarinan cyst removed if possible, LSO is needed.  Prior ultrasounds have shown paratubal cysts and this will be removed if pressure.  Current plan is to also removed both fallopian tubes.    Procedure discussed with patient.  Hospital stay, recovery and pain management all discussed.  Risks discussed including but not limited to bleeding, <1% risk of receiving a  transfusion, infection, 1% risk of bowel/bladder/ureteral/vascular injury discussed as well as possible need for additional surgery if injury does occur discussed.  DVT/PE and rare risk of death discussed.  My actual complications with prior surgeries discussed.  Vaginal cuff dehiscence discussed.  Hernia formation discussed.  Positioning and incision locations discussed.  Patient aware if pathology abnormal she may need additional treatment.  All questions answered.     Ob Hx:   Patient's last menstrual period was 06/10/2018.          Sexually active: No. Birth control: abstinence Last pap: 12/21/15 neg. HR HPV:neg  Last MMG: 09/21/17 BIRADS1:Neg  Tobacco: No  Past Surgical History:    Procedure Laterality Date  . CESAREAN SECTION  2000  . POLYPECTOMY  2010    Past Medical History:  Diagnosis Date  . Migraines    no aura  . Sinus problem   . STD (sexually transmitted disease)    HSV 2    Allergies: Patient has no known allergies.  Current Outpatient Medications  Medication Sig Dispense Refill  . loratadine (CLARITIN) 10 MG tablet Take 10 mg by mouth as needed for allergies.    . RELPAX 40 MG tablet Take 1 tablet (40 mg total) by mouth as needed for migraine. May repeat in 2 hours if headache persists or recurs. (90 day Rx) 6 tablet 11  . topiramate (TOPAMAX) 25 MG tablet Take 2 tablets (50 mg total) by mouth 2 (two) times daily. 120 tablet 11   No current facility-administered medications for this visit.     ROS: A comprehensive review of systems was negative.  Exam:    BP 118/62   Pulse 72   Resp 16   Ht 5' 4.5" (1.638 m)   Wt 176 lb 9.6 oz (80.1 kg)   LMP 06/10/2018   BMI 29.85 kg/m   General appearance: alert and cooperative Head: Normocephalic, without obvious abnormality, atraumatic Neck: no adenopathy, supple, symmetrical, trachea midline and thyroid not enlarged, symmetric, no tenderness/mass/nodules Lungs: clear to auscultation bilaterally Heart: regular rate and rhythm, S1, S2 normal, no murmur, click, rub or gallop Abdomen: soft, non-tender; bowel sounds normal; no masses,  no organomegaly  Extremities: extremities normal, atraumatic, no cyanosis or edema Skin: Skin color, texture, turgor normal. No rashes or lesions Lymph nodes: Cervical, supraclavicular, and axillary nodes normal. no inguinal nodes palpated Neurologic: Grossly normal  Pelvic: External genitalia:  no lesions              Urethra: normal appearing urethra with no masses, tenderness or lesions              Bartholins and Skenes: normal                 Vagina: normal appearing vagina with normal color and discharge, no lesions              Cervix: normal appearance               Pap taken: No.        Bimanual Exam:  Uterus:  uterus is normal size, shape, consistency and nontender                                      Adnexa:    normal adnexa in size, nontender and no masses, cannot feel left ovarian cyst                                      Rectovaginal: Deferred   A: left ~4.5cm cyst that is currently not enlarging Prior right paratubal cyst  P:  Laparoscopic left ovarian cystectomy, possible LSO, bilateral salpingectomy is planned.   Medications/Vitamins reviewed.  Peri and post operative instructions reviewed.  About 15 minutes spent reviewing ultrasound and discussing exact wording on consent in additional to reviewing all pre-operative risks/benefits/alternatives/hospital stay.

## 2018-06-18 ENCOUNTER — Other Ambulatory Visit: Payer: Self-pay

## 2018-06-18 ENCOUNTER — Encounter (HOSPITAL_BASED_OUTPATIENT_CLINIC_OR_DEPARTMENT_OTHER): Payer: Self-pay | Admitting: *Deleted

## 2018-06-18 NOTE — Progress Notes (Signed)
Spoke with Rachel Anthony after midnight, arrive 530 am 06-24-18 [facility] meds to take sip of water: topamax, relpax prn Driver sister donna faile Has pre op appointment 06-20-18 900 am for cbc Needs urine pregnancy day of surgery Has surgery orders in epic

## 2018-06-20 ENCOUNTER — Encounter (HOSPITAL_COMMUNITY)
Admission: RE | Admit: 2018-06-20 | Discharge: 2018-06-20 | Disposition: A | Discharge status: Home / Self Care | Payer: 59 | Source: Ambulatory Visit | Attending: Obstetrics & Gynecology | Admitting: Obstetrics & Gynecology

## 2018-06-20 DIAGNOSIS — Z01812 Encounter for preprocedural laboratory examination: Secondary | ICD-10-CM | POA: Diagnosis not present

## 2018-06-20 LAB — CBC
HEMATOCRIT: 44.1 % (ref 36.0–46.0)
HEMOGLOBIN: 13.7 g/dL (ref 12.0–15.0)
MCH: 30 pg (ref 26.0–34.0)
MCHC: 31.1 g/dL (ref 30.0–36.0)
MCV: 96.5 fL (ref 80.0–100.0)
Platelets: 194 10*3/uL (ref 150–400)
RBC: 4.57 MIL/uL (ref 3.87–5.11)
RDW: 12.3 % (ref 11.5–15.5)
WBC: 7.2 10*3/uL (ref 4.0–10.5)
nRBC: 0 % (ref 0.0–0.2)

## 2018-06-24 ENCOUNTER — Ambulatory Visit (HOSPITAL_BASED_OUTPATIENT_CLINIC_OR_DEPARTMENT_OTHER): Payer: 59 | Admitting: Anesthesiology

## 2018-06-24 ENCOUNTER — Encounter (HOSPITAL_BASED_OUTPATIENT_CLINIC_OR_DEPARTMENT_OTHER)
Admission: RE | Disposition: A | Discharge status: Home / Self Care | Payer: Self-pay | Source: Ambulatory Visit | Attending: Obstetrics & Gynecology

## 2018-06-24 ENCOUNTER — Other Ambulatory Visit: Payer: Self-pay | Admitting: Obstetrics & Gynecology

## 2018-06-24 ENCOUNTER — Ambulatory Visit (HOSPITAL_BASED_OUTPATIENT_CLINIC_OR_DEPARTMENT_OTHER)
Admission: RE | Admit: 2018-06-24 | Discharge: 2018-06-24 | Disposition: A | Discharge status: Home / Self Care | Payer: 59 | Source: Ambulatory Visit | Attending: Obstetrics & Gynecology | Admitting: Obstetrics & Gynecology

## 2018-06-24 ENCOUNTER — Encounter (HOSPITAL_BASED_OUTPATIENT_CLINIC_OR_DEPARTMENT_OTHER): Payer: Self-pay | Admitting: *Deleted

## 2018-06-24 ENCOUNTER — Other Ambulatory Visit: Payer: Self-pay

## 2018-06-24 DIAGNOSIS — N809 Endometriosis, unspecified: Secondary | ICD-10-CM | POA: Diagnosis not present

## 2018-06-24 DIAGNOSIS — N838 Other noninflammatory disorders of ovary, fallopian tube and broad ligament: Secondary | ICD-10-CM | POA: Diagnosis not present

## 2018-06-24 DIAGNOSIS — N7011 Chronic salpingitis: Secondary | ICD-10-CM | POA: Diagnosis not present

## 2018-06-24 DIAGNOSIS — R8569 Abnormal cytological findings in specimens from other digestive organs and abdominal cavity: Secondary | ICD-10-CM | POA: Diagnosis not present

## 2018-06-24 DIAGNOSIS — N803 Endometriosis of pelvic peritoneum: Secondary | ICD-10-CM | POA: Diagnosis not present

## 2018-06-24 DIAGNOSIS — Z79899 Other long term (current) drug therapy: Secondary | ICD-10-CM | POA: Insufficient documentation

## 2018-06-24 DIAGNOSIS — N83202 Unspecified ovarian cyst, left side: Secondary | ICD-10-CM | POA: Diagnosis not present

## 2018-06-24 HISTORY — PX: LAPAROSCOPIC OVARIAN CYSTECTOMY: SHX6248

## 2018-06-24 HISTORY — PX: CYSTOSCOPY: SHX5120

## 2018-06-24 HISTORY — PX: LAPAROSCOPIC UNILATERAL SALPINGECTOMY: SHX5934

## 2018-06-24 LAB — POCT PREGNANCY, URINE: Preg Test, Ur: NEGATIVE

## 2018-06-24 SURGERY — EXCISION, CYST, OVARY, LAPAROSCOPIC
Anesthesia: General | Laterality: Right

## 2018-06-24 MED ORDER — DEXAMETHASONE SODIUM PHOSPHATE 10 MG/ML IJ SOLN
INTRAMUSCULAR | Status: DC | PRN
  Administered 2018-06-24: 10 mg via INTRAVENOUS

## 2018-06-24 MED ORDER — LIDOCAINE 2% (20 MG/ML) 5 ML SYRINGE
INTRAMUSCULAR | Status: AC
  Filled 2018-06-24: qty 5

## 2018-06-24 MED ORDER — KETOROLAC TROMETHAMINE 30 MG/ML IJ SOLN
INTRAMUSCULAR | Status: AC
  Filled 2018-06-24: qty 1

## 2018-06-24 MED ORDER — SUGAMMADEX SODIUM 200 MG/2ML IV SOLN
INTRAVENOUS | Status: AC
  Filled 2018-06-24: qty 2

## 2018-06-24 MED ORDER — ONDANSETRON HCL 4 MG/2ML IJ SOLN
INTRAMUSCULAR | Status: DC | PRN
  Administered 2018-06-24: 4 mg via INTRAVENOUS

## 2018-06-24 MED ORDER — FENTANYL CITRATE (PF) 100 MCG/2ML IJ SOLN
INTRAMUSCULAR | Status: DC | PRN
  Administered 2018-06-24 (×4): 50 ug via INTRAVENOUS

## 2018-06-24 MED ORDER — KETOROLAC TROMETHAMINE 30 MG/ML IJ SOLN
30.0000 mg | Freq: Once | INTRAMUSCULAR | Status: DC | PRN
  Filled 2018-06-24: qty 1

## 2018-06-24 MED ORDER — HYDROCODONE-ACETAMINOPHEN 5-325 MG PO TABS: 1.0000 | ORAL_TABLET | Freq: Four times a day (QID) | ORAL | 0 refills | Status: DC | PRN

## 2018-06-24 MED ORDER — SODIUM CHLORIDE 0.9 % IR SOLN
Status: DC | PRN
  Administered 2018-06-24: 1000 mL

## 2018-06-24 MED ORDER — ARTIFICIAL TEARS OPHTHALMIC OINT
TOPICAL_OINTMENT | OPHTHALMIC | Status: AC
  Filled 2018-06-24: qty 3.5

## 2018-06-24 MED ORDER — ONDANSETRON HCL 4 MG/2ML IJ SOLN
INTRAMUSCULAR | Status: AC
  Filled 2018-06-24: qty 2

## 2018-06-24 MED ORDER — LACTATED RINGERS IV SOLN
INTRAVENOUS | Status: DC
  Administered 2018-06-24 (×2): via INTRAVENOUS
  Filled 2018-06-24: qty 1000

## 2018-06-24 MED ORDER — BUPIVACAINE HCL 0.25 % IJ SOLN
INTRAMUSCULAR | Status: DC | PRN
  Administered 2018-06-24: 10 mL

## 2018-06-24 MED ORDER — FENTANYL CITRATE (PF) 100 MCG/2ML IJ SOLN
INTRAMUSCULAR | Status: AC
  Filled 2018-06-24: qty 2

## 2018-06-24 MED ORDER — HYDROMORPHONE HCL 1 MG/ML IJ SOLN
INTRAMUSCULAR | Status: AC
  Filled 2018-06-24: qty 1

## 2018-06-24 MED ORDER — FENTANYL CITRATE (PF) 250 MCG/5ML IJ SOLN
INTRAMUSCULAR | Status: AC
  Filled 2018-06-24: qty 5

## 2018-06-24 MED ORDER — PROMETHAZINE HCL 25 MG/ML IJ SOLN
6.2500 mg | INTRAMUSCULAR | Status: DC | PRN
  Filled 2018-06-24: qty 1

## 2018-06-24 MED ORDER — KETOROLAC TROMETHAMINE 30 MG/ML IJ SOLN
INTRAMUSCULAR | Status: DC | PRN
  Administered 2018-06-24: 30 mg via INTRAVENOUS

## 2018-06-24 MED ORDER — MIDAZOLAM HCL 2 MG/2ML IJ SOLN
INTRAMUSCULAR | Status: AC
  Filled 2018-06-24: qty 2

## 2018-06-24 MED ORDER — HYDROCODONE-ACETAMINOPHEN 5-325 MG PO TABS
ORAL_TABLET | ORAL | Status: AC
  Filled 2018-06-24: qty 1

## 2018-06-24 MED ORDER — DEXAMETHASONE SODIUM PHOSPHATE 10 MG/ML IJ SOLN
INTRAMUSCULAR | Status: AC
  Filled 2018-06-24: qty 1

## 2018-06-24 MED ORDER — PROPOFOL 10 MG/ML IV BOLUS
INTRAVENOUS | Status: DC | PRN
  Administered 2018-06-24: 150 mg via INTRAVENOUS

## 2018-06-24 MED ORDER — MIDAZOLAM HCL 2 MG/2ML IJ SOLN
INTRAMUSCULAR | Status: DC | PRN
  Administered 2018-06-24: 2 mg via INTRAVENOUS

## 2018-06-24 MED ORDER — SUGAMMADEX SODIUM 200 MG/2ML IV SOLN
INTRAVENOUS | Status: DC | PRN
  Administered 2018-06-24: 200 mg via INTRAVENOUS

## 2018-06-24 MED ORDER — HYDROMORPHONE HCL 1 MG/ML IJ SOLN
0.2500 mg | INTRAMUSCULAR | Status: DC | PRN
  Administered 2018-06-24 (×3): 0.25 mg via INTRAVENOUS
  Filled 2018-06-24: qty 0.5

## 2018-06-24 MED ORDER — HYDROCODONE-ACETAMINOPHEN 5-325 MG PO TABS
1.0000 | ORAL_TABLET | Freq: Once | ORAL | Status: AC
  Administered 2018-06-24: 1 via ORAL
  Filled 2018-06-24: qty 1

## 2018-06-24 MED ORDER — IBUPROFEN 800 MG PO TABS: 800.0000 mg | ORAL_TABLET | Freq: Three times a day (TID) | ORAL | 0 refills | Status: DC | PRN

## 2018-06-24 MED ORDER — SCOPOLAMINE 1 MG/3DAYS TD PT72
1.0000 | MEDICATED_PATCH | TRANSDERMAL | Status: DC
  Administered 2018-06-24: 1.5 mg via TRANSDERMAL
  Filled 2018-06-24: qty 1

## 2018-06-24 MED ORDER — ROCURONIUM BROMIDE 10 MG/ML (PF) SYRINGE
PREFILLED_SYRINGE | INTRAVENOUS | Status: DC | PRN
  Administered 2018-06-24: 50 mg via INTRAVENOUS
  Administered 2018-06-24: 10 mg via INTRAVENOUS

## 2018-06-24 MED ORDER — PROPOFOL 10 MG/ML IV BOLUS
INTRAVENOUS | Status: AC
  Filled 2018-06-24: qty 40

## 2018-06-24 MED ORDER — LIDOCAINE 2% (20 MG/ML) 5 ML SYRINGE
INTRAMUSCULAR | Status: DC | PRN
  Administered 2018-06-24: 100 mg via INTRAVENOUS

## 2018-06-24 MED ORDER — SCOPOLAMINE 1 MG/3DAYS TD PT72
MEDICATED_PATCH | TRANSDERMAL | Status: AC
  Filled 2018-06-24: qty 1

## 2018-06-24 MED ORDER — ROCURONIUM BROMIDE 10 MG/ML (PF) SYRINGE
PREFILLED_SYRINGE | INTRAVENOUS | Status: AC
  Filled 2018-06-24: qty 10

## 2018-06-24 SURGICAL SUPPLY — 63 items
APPLICATOR ARISTA FLEXITIP XL (MISCELLANEOUS) ×4 IMPLANT
BENZOIN TINCTURE PRP APPL 2/3 (GAUZE/BANDAGES/DRESSINGS) IMPLANT
BLADE CLIPPER SURG (BLADE) IMPLANT
CABLE HIGH FREQUENCY MONO STRZ (ELECTRODE) ×4 IMPLANT
CANISTER SUCT 3000ML PPV (MISCELLANEOUS) IMPLANT
CANISTER SUCTION 1200CC (MISCELLANEOUS) IMPLANT
CATH ROBINSON RED A/P 16FR (CATHETERS) IMPLANT
COVER MAYO STAND STRL (DRAPES) ×4 IMPLANT
COVER WAND RF STERILE (DRAPES) ×4 IMPLANT
DECANTER SPIKE VIAL GLASS SM (MISCELLANEOUS) ×8 IMPLANT
DERMABOND ADVANCED (GAUZE/BANDAGES/DRESSINGS) ×1
DERMABOND ADVANCED .7 DNX12 (GAUZE/BANDAGES/DRESSINGS) ×3 IMPLANT
DRSG COVADERM PLUS 2X2 (GAUZE/BANDAGES/DRESSINGS) IMPLANT
DRSG OPSITE POSTOP 3X4 (GAUZE/BANDAGES/DRESSINGS) ×4 IMPLANT
DRSG TELFA 3X8 NADH (GAUZE/BANDAGES/DRESSINGS) IMPLANT
DURAPREP 26ML APPLICATOR (WOUND CARE) ×4 IMPLANT
GAUZE 4X4 16PLY RFD (DISPOSABLE) ×8 IMPLANT
GLOVE BIOGEL PI IND STRL 7.0 (GLOVE) ×9 IMPLANT
GLOVE BIOGEL PI INDICATOR 7.0 (GLOVE) ×3
GLOVE ECLIPSE 6.5 STRL STRAW (GLOVE) ×8 IMPLANT
GOWN STRL REUS W/TWL LRG LVL3 (GOWN DISPOSABLE) ×4 IMPLANT
HEMOSTAT ARISTA ABSORB 3G PWDR (MISCELLANEOUS) ×4 IMPLANT
KIT TURNOVER CYSTO (KITS) ×4 IMPLANT
LIGASURE VESSEL 5MM BLUNT TIP (ELECTROSURGICAL) ×4 IMPLANT
NEEDLE HYPO 25X1 1.5 SAFETY (NEEDLE) ×4 IMPLANT
NEEDLE INSUFFLATION 120MM (ENDOMECHANICALS) ×4 IMPLANT
NEEDLE INSUFFLATION 14GA 150MM (NEEDLE) IMPLANT
NS IRRIG 500ML POUR BTL (IV SOLUTION) ×4 IMPLANT
PACK LAPAROSCOPY BASIN (CUSTOM PROCEDURE TRAY) ×4 IMPLANT
PACK TRENDGUARD 450 HYBRID PRO (MISCELLANEOUS) ×3 IMPLANT
PAD OB MATERNITY 4.3X12.25 (PERSONAL CARE ITEMS) ×4 IMPLANT
POUCH LAPAROSCOPIC INSTRUMENT (MISCELLANEOUS) ×4 IMPLANT
PROTECTOR NERVE ULNAR (MISCELLANEOUS) ×8 IMPLANT
SCISSORS LAP 5X35 DISP (ENDOMECHANICALS) ×4 IMPLANT
SEALER TISSUE G2 CVD JAW 35 (ENDOMECHANICALS) IMPLANT
SEALER TISSUE G2 CVD JAW 45CM (ENDOMECHANICALS)
SET IRRIG TUBING LAPAROSCOPIC (IRRIGATION / IRRIGATOR) ×4 IMPLANT
SET IRRIG Y TYPE TUR BLADDER L (SET/KITS/TRAYS/PACK) ×4 IMPLANT
SHEARS HARMONIC ACE PLUS 36CM (ENDOMECHANICALS) ×4 IMPLANT
SLEEVE ADV FIXATION 5X100MM (TROCAR) ×4 IMPLANT
SOLUTION ELECTROLUBE (MISCELLANEOUS) IMPLANT
STRIP CLOSURE SKIN 1/4X4 (GAUZE/BANDAGES/DRESSINGS) IMPLANT
SUT VIC AB 4-0 SH 27 (SUTURE)
SUT VIC AB 4-0 SH 27XANBCTRL (SUTURE) IMPLANT
SUT VICRYL 0 UR6 27IN ABS (SUTURE) ×4 IMPLANT
SUT VICRYL 4-0 PS2 18IN ABS (SUTURE) ×4 IMPLANT
SYR 10ML LL (SYRINGE) ×4 IMPLANT
SYR 30ML LL (SYRINGE) IMPLANT
SYR 3ML 23GX1 SAFETY (SYRINGE) IMPLANT
SYS BAG RETRIEVAL 10MM (BASKET)
SYSTEM BAG RETRIEVAL 10MM (BASKET) IMPLANT
SYSTEM CARTER THOMASON II (TROCAR) ×4 IMPLANT
TOWEL OR 17X24 6PK STRL BLUE (TOWEL DISPOSABLE) ×8 IMPLANT
TRAY FOLEY W/BAG SLVR 14FR (SET/KITS/TRAYS/PACK) ×4 IMPLANT
TRENDGUARD 450 HYBRID PRO PACK (MISCELLANEOUS) ×4
TROCAR ADV FIXATION 5X100MM (TROCAR) ×4 IMPLANT
TROCAR XCEL NON-BLD 11X100MML (ENDOMECHANICALS) ×4 IMPLANT
TROCAR XCEL NON-BLD 5MMX100MML (ENDOMECHANICALS) ×8 IMPLANT
TUBE CONNECTING 12X1/4 (SUCTIONS) IMPLANT
TUBING INSUF HEATED (TUBING) ×4 IMPLANT
TUBING TUR DISP (UROLOGICAL SUPPLIES) ×4 IMPLANT
WARMER LAPAROSCOPE (MISCELLANEOUS) ×4 IMPLANT
WATER STERILE IRR 500ML POUR (IV SOLUTION) ×4 IMPLANT

## 2018-06-24 NOTE — H&P (Signed)
Rachel Anthony is an 52 y.o. female  G1P1 DWF with left adnexal cyst.  On ultrasound it is not clear if this is paratubal or ovarian.  It is about 4cm in size and is simple in appearance.  Due to size, removal recommended.  Risks, benefits and alternatives have been discussed.  Pt is here and ready to proceed.  Pertinent Gynecological History: Menses: regular Contraception: abstinence DES exposure: denies Blood transfusions: none Sexually transmitted diseases: no past history Previous GYN Procedures: none  Last mammogram: normal Date: 09/21/17 Last pap: normal Date: 12/21/15 neg with neg HR HPV OB History: G1, P1   Menstrual History: Patient's last menstrual period was 06/10/2018.    Past Medical History:  Diagnosis Date  . Migraines    no aura  . Sinus problem   . STD (sexually transmitted disease)    HSV 2    Past Surgical History:  Procedure Laterality Date  . CESAREAN SECTION  2000  . POLYPECTOMY  2010    Family History  Problem Relation Age of Onset  . Hypertension Mother   . Cancer - Colon Paternal Grandmother   . Heart disease Maternal Grandmother     Social History:  reports that she has never smoked. She has never used smokeless tobacco. She reports that she does not drink alcohol or use drugs.  Allergies: No Known Allergies  Medications Prior to Admission  Medication Sig Dispense Refill Last Dose  . RELPAX 40 MG tablet Take 1 tablet (40 mg total) by mouth as needed for migraine. May repeat in 2 hours if headache persists or recurs. (90 day Rx) 6 tablet 11 06/23/2018 at Unknown time  . topiramate (TOPAMAX) 25 MG tablet Take 2 tablets (50 mg total) by mouth 2 (two) times daily. 120 tablet 11 Past Week at Unknown time  . loratadine (CLARITIN) 10 MG tablet Take 10 mg by mouth as needed for allergies.   More than a month at Unknown time    Review of Systems  All other systems reviewed and are negative.   Blood pressure (!) 151/80, pulse 76, temperature 98.8  F (37.1 C), temperature source Oral, resp. rate 16, height 5' 4.5" (1.638 m), weight 79.8 kg, last menstrual period 06/10/2018, SpO2 100 %. Physical Exam  Constitutional: She is oriented to person, place, and time. She appears well-developed and well-nourished.  Cardiovascular: Normal rate and regular rhythm.  Respiratory: Effort normal and breath sounds normal.  Neurological: She is alert and oriented to person, place, and time.  Skin: Skin is warm and dry.  Psychiatric: She has a normal mood and affect.    Results for orders placed or performed during the hospital encounter of 06/24/18 (from the past 24 hour(s))  Pregnancy, urine POC     Status: None   Collection Time: 06/24/18  6:25 AM  Result Value Ref Range   Preg Test, Ur NEGATIVE NEGATIVE    No results found.  Assessment/Plan: 52 yo G1P1 DWF here for laparoscopic salpingectomy and left adnexal cyst removal vs possible LSO if this cyst is actually part of the ovary.  Risks, benefits have been reviewed.  Pt here and ready to proceed.  Rachel Anthony 06/24/2018, 7:21 AM

## 2018-06-24 NOTE — Transfer of Care (Signed)
Immediate Anesthesia Transfer of Care Note  Patient: Rachel Anthony  Procedure(s) Performed: LAPAROSCOPIC OOPHERECTOMY LYSIS OF ADHESIONS (Left ) LAPAROSCOPIC BILATERAL SALPINGECTOMY (Right ) CYSTOSCOPY  Patient Location: PACU  Anesthesia Type:General  Level of Consciousness: awake, alert  and oriented  Airway & Oxygen Therapy: Patient Spontanous Breathing and Patient connected to nasal cannula oxygen  Post-op Assessment: Report given to RN and Post -op Vital signs reviewed and stable  Post vital signs: Reviewed and stable  Last Vitals:  Vitals Value Taken Time  BP    Temp    Pulse    Resp    SpO2      Last Pain:  Vitals:   06/24/18 0633  TempSrc:   PainSc: 0-No pain      Patients Stated Pain Goal: 7 ([id number])  Complications: No apparent anesthesia complications

## 2018-06-24 NOTE — Anesthesia Procedure Notes (Signed)
Procedure Name: Intubation Date/Time: 06/24/2018 7:37 AM Performed by: Myrtie Soman, MD Pre-anesthesia Checklist: Patient identified, Emergency Drugs available, Suction available and Patient being monitored Patient Re-evaluated:Patient Re-evaluated prior to induction Oxygen Delivery Method: Circle system utilized Preoxygenation: Pre-oxygenation with 100% oxygen Induction Type: IV induction Ventilation: Mask ventilation without difficulty Laryngoscope Size: Mac and 3 Grade View: Grade I Tube type: Oral Tube size: 7.0 mm Number of attempts: 1 Airway Equipment and Method: Stylet Placement Confirmation: ETT inserted through vocal cords under direct vision,  positive ETCO2 and breath sounds checked- equal and bilateral Secured at: 21 cm Tube secured with: Tape Dental Injury: Teeth and Oropharynx as per pre-operative assessment

## 2018-06-24 NOTE — Discharge Instructions (Signed)
Do not take any nonsteroidal anti inflammatories (Ibuprofen, Advil, Motrin, Aleve) until after 3:30 pm today.  Post Anesthesia Home Care Instructions  Activity: Get plenty of rest for the remainder of the day. A responsible individual must stay with you for 24 hours following the procedure.  For the next 24 hours, DO NOT: -Drive a car -Paediatric nurse -Drink alcoholic beverages -Take any medication unless instructed by your physician -Make any legal decisions or sign important papers.  Meals: Start with liquid foods such as gelatin or soup. Progress to regular foods as tolerated. Avoid greasy, spicy, heavy foods. If nausea and/or vomiting occur, drink only clear liquids until the nausea and/or vomiting subsides. Call your physician if vomiting continues.  Special Instructions/Symptoms: Your throat may feel dry or sore from the anesthesia or the breathing tube placed in your throat during surgery. If this causes discomfort, gargle with warm salt water. The discomfort should disappear within 24 hours.  If you had a scopolamine patch placed behind your ear for the management of post- operative nausea and/or vomiting:  1. The medication in the patch is effective for 72 hours, after which it should be removed.  Wrap patch in a tissue and discard in the trash. Wash hands thoroughly with soap and water. 2. You may remove the patch earlier than 72 hours if you experience unpleasant side effects which may include dry mouth, dizziness or visual disturbances. 3. Avoid touching the patch. Wash your hands with soap and water after contact with the patch.  Post-surgical Instructions, Outpatient Surgery  You may expect to feel dizzy, weak, and drowsy for as long as 24 hours after receiving the medicine that made you sleep (anesthetic). For the first 24 hours after your surgery:    Do not drive a car, ride a bicycle, participate in physical activities, or take public transportation until you are done  taking narcotic pain medicines or as directed by Dr. Sabra Heck.   Do not drink alcohol or take tranquilizers.   Do not take medicine that has not been prescribed by your physicians.   Do not sign important papers or make important decisions while on narcotic pain medicines.   Have a responsible person with you.   PAIN MANAGEMENT  Motrin 800mg .  (This is the same as 4-200mg  over the counter tablets of Motrin or ibuprofen.)  You may take this every eight hours or as needed for cramping.    Vicodin 5/325mg .  For more severe pain, take one or two tablets every four to six hours as needed for pain control.  (Remember that narcotic pain medications increase your risk of constipation.  If this becomes a problem, you may take an over the counter stool softener like Colace 100mg  up to four times a day.)  DO'S AND DON'T'S  Do not take a tub bath for one week.  You may shower on the first day after your surgery  Do not do any heavy lifting for one to two weeks.  This increases the chance of bleeding.  Do move around as you feel able.  Stairs are fine.  You may begin to exercise again as you feel able.  Do not lift any weights for two weeks.  Do not put anything in the vagina for two weeks--no tampons, intercourse, or douching.    REGULAR MEDIATIONS/VITAMINS:  You may restart all of your regular medications as prescribed.  You may restart all of your vitamins as you normally take them.    [place]  MEDICAL CARE IF:  You have persistent nausea and vomiting.   You have trouble eating or drinking.   You have an oral temperature above 100.5.   You have constipation that is not helped by adjusting diet or increasing fluid intake. Pain medicines are a common cause of constipation.   You have heavy vaginal bleeding

## 2018-06-24 NOTE — Anesthesia Preprocedure Evaluation (Signed)
Anesthesia Evaluation  Patient identified by MRN, date of birth, ID band Patient awake    Reviewed: Allergy & Precautions, NPO status , Patient's Chart, lab work & pertinent test results  Airway Mallampati: II  TM Distance: >3 FB Neck ROM: Full    Dental no notable dental hx.    Pulmonary neg pulmonary ROS,    Pulmonary exam normal breath sounds clear to auscultation       Cardiovascular negative cardio ROS Normal cardiovascular exam Rhythm:Regular Rate:Normal     Neuro/Psych negative neurological ROS  negative psych ROS   GI/Hepatic negative GI ROS, Neg liver ROS,   Endo/Other  negative endocrine ROS  Renal/GU negative Renal ROS  negative genitourinary   Musculoskeletal negative musculoskeletal ROS (+)   Abdominal   Peds negative pediatric ROS (+)  Hematology negative hematology ROS (+)   Anesthesia Other Findings   Reproductive/Obstetrics negative OB ROS                             Anesthesia Physical Anesthesia Plan  ASA: II  Anesthesia Plan: General   Post-op Pain Management:    Induction: Intravenous  PONV Risk Score and Plan: 4 or greater and Ondansetron, Dexamethasone, Midazolam, Scopolamine patch - Pre-op and Treatment may vary due to age or medical condition  Airway Management Planned: Oral ETT  Additional Equipment:   Intra-op Plan:   Post-operative Plan: Extubation in OR  Informed Consent: I have reviewed the patients History and Physical, chart, labs and discussed the procedure including the risks, benefits and alternatives for the proposed anesthesia with the patient or authorized representative who has indicated his/her understanding and acceptance.   Dental advisory given  Plan Discussed with: CRNA and Surgeon  Anesthesia Plan Comments:         Anesthesia Quick Evaluation

## 2018-06-24 NOTE — Anesthesia Postprocedure Evaluation (Signed)
Anesthesia Post Note  Patient: [name]  Procedure(s) Performed: LAPAROSCOPIC OOPHERECTOMY LYSIS OF ADHESIONS (Left ) LAPAROSCOPIC BILATERAL SALPINGECTOMY (Right ) CYSTOSCOPY     Patient location during evaluation: PACU Anesthesia Type: General Level of consciousness: awake and alert Pain management: pain level controlled Vital Signs Assessment: post-procedure vital signs reviewed and stable Respiratory status: spontaneous breathing, nonlabored ventilation, respiratory function stable and patient connected to nasal cannula oxygen Cardiovascular status: blood pressure returned to baseline and stable Postop Assessment: no apparent nausea or vomiting Anesthetic complications: no    Last Vitals:  Vitals:   06/24/18 1005 06/24/18 1007  BP: (!) 89/76 114/74  Pulse: 96 94  Resp: (!) 21 13  Temp: 37.2 C   SpO2: 97% 97%    Last Pain:  Vitals:   06/24/18 0633  TempSrc:   PainSc: 0-No pain                 Annmargaret Decaprio S

## 2018-06-24 NOTE — Op Note (Addendum)
06/24/2018  10:33 AM  PATIENT:  Rachel Anthony  52 y.o. female  PRE-OPERATIVE DIAGNOSIS:  enlarging left ovarian cyst  POST-OPERATIVE DIAGNOSIS:  Left paratubal cyst, endometriosis, right paratubal cyst  PROCEDURE:  Procedure(s): LAPAROSCOPIC LEFT SALPINGOOPHORECTOMY, LAPAROSCOPIC RIGHT SALPINGECTOMY CYSTOSCOPY  SURGEON:  Megan Salon  ASSISTANTS: Josefa Half, MD   ANESTHESIA:   general  ESTIMATED BLOOD LOSS: 30 mL  BLOOD ADMINISTERED:none   FLUIDS: 1300cc LR  UOP: 250cc clear UOP  SPECIMEN:  Left tube and ovary, right fallopian tube, pelvic washings  DISPOSITION OF SPECIMEN:  PATHOLOGY  FINDINGS: endometriosis on both sidewalls, left ovary and tube were completely adhered to the left side wall, left fimbriated end was scarred closed, Stage III endometriosis noted  DESCRIPTION OF OPERATION: Patient is taken to the operating room. She is placed in the supine position. She is a running IV in place. Informed consent was present on the chart. SCDs on her lower extremities and functioning properly. Patient was positioned while she was awake.  Her legs were placed in the low lithotomy position in [place]. Her arms were tucked by the side.  General endotracheal anesthesia was administered by the anesthesia staff without difficulty. Dr. Kalman Shan, anesthesia, oversaw case.  Time out performed.    Chlora prep was then used to prep the abdomen and Betadine was used to prep the inner thighs, perineum and vagina. Once 3 minutes had past the patient was draped in a normal standard fashion. The legs were lifted to the high lithotomy position. The cervix was visualized by placing a bivalved speculum that opened on one side into the vagina. The anterior lip of the cervix was grasped with single-tooth tenaculum.  The cervix sounded to 7 cm. A hulka clamp was passed through the cervical canal and into the uterine cavity.  The anterior lip of the cervix was grasped with this device and the  tenaculum was removed.  There was good manipulation of the uterus. The speculum was removed as well. A Foley catheter was placed to straight drain.  Clear urine was noted. Legs were lowered to the low lithotomy position and attention was turned the abdomen.  The umbilicus was everted.  A Veress needle was obtained. Syringe of sterile saline was placed on a open Veress needle.  This was passed into the umbilicus until just when the fluid started to drip.  Then low flow CO2 gas was attached the needle and the pneumoperitoneum was achieved without difficulty. Once 3.4 liters of gas was in the abdomen the Veress needle was removed and a 5 millimeter non-bladed Optiview trocar and port were passed directly to the abdomen. The laparoscope was then used to confirm intraperitoneal placement. Upper abdomen was normal.  Appendix could be seen but the tip was not seen at it was adhered to the side wall.  The uterus appeared normal with some small fibroids.    Endometriosis was located along the bladder, along both side walls and likely underneath the ovary and tube on the left side causing the adhesive findings.  The ovaries did appear normal.  Photo documentation was taken.  Locations for RLQ, LLQ, and suprapubic ports were noted by transillumination of the abdominal wall.  0.25% marcaine was used to anesthetize the skin.  69mm skin incision was made in the LLQ and a 10 port was placed in the LLQ.  This was done with direct visualization of the laparoscope.  Then a 74mm skin incision was made and a 64mm nonbladed trochar and port  was placed in the RLQ.  Finally, and 10mm skin incision was made about 4cm above the pubic symphasis and an 70mm non-bladed port was placed with direct visualization of the laparoscope.  All trochars were removed.  Pelvic washings were obtained.  Ureter was identified on the right.  Due to the adhesions on the left, the ureter could not be seen on the left.  Attention was turned to the left side.  The uterine ovarian pedicle was calmped, cauterized and incised.  Then the left round ligament was clamped, cauterized and incised.  this allowed visualization of the left cyst from the anterior side of the cyst.  With this on traction and pulling away from the side wall, the adhesions to the side wall were incised with laparoscopic scissors.  Once this initial dissection was performed, the ovary then peeled off the side wall.  Adhesions inferiorly needed to be incised as well.  This was then detached from the ovary.  The ovary was still adhered and removal of the tube did not help with visualization of the ureter so the side wall was opened and the filmy tissue in this plane dissected.  Down low, the uterus was felt to be seen but normal peristalsis was not clearly seen due to the thickened tissue present from the adhesions.  However, the IP could be isolated above this and the ovary was clamped, cauterized and incised, freeing the ovary.    Then attention was turned the right side.  The uterus was placed on stretch to the opposite side.  The tube was excised off the ovary using a ligasure device.  Then the mesosalpinx was incised freeing the tube.  Finally the tube was clamped, cauterized and incised next to the uterus, freeing the tube.    An endocatch bag was used for all the ovary and cyst that was still intact.  The tubes were removed from the pelvis prior to placing the ovary and cyst in the bag.  This was brought to the surface and the cyst opened in the bag.  There was no spills.  The specimen was fully delivered.   At this point, the pelvis was irrigated, no bleeding was noted.    Foley was removed.  Cystoscopy was performed at this point due to the more difficult dissection on the left side.   Bladder was intact.  Bubble was noted on the dome of the bladder.  Normal urine jets were noted from each ureteral orifice.  No blood was noted.  The cystoscopic fluid was drained and the foley left out.     Then gown and gloves were changed.  Attention was turned back to the abdomen.  Pelvic was visualized again under low pressures. No bleeding was noted.  Arista was placed along the pedicles.  At this point the procedure was completed. The LLQ port was removed and the incision was was closed with a fascial closure device.  Suture was tied and excellent closure of the fascia was noted.  The remaining instruments were removed.  The ports were removed under direct visualization of the laparoscope and the pneumoperitoneum was relieved.  The patient was taken out of Trendelenburg positioning.  Several deep breaths were given to the patient's trying to any gas the abdomen and finally the RLQ port was removed.  The skin was then closed with subcuticular stitches of 3-0 Vicryl. The skin was cleansed Dermabond was applied.  Sponge stick was used to check the cervix and vaginal for blood.  Minimal  bleeding was noted.  Sponge, lap, needle, initially counts were correct x2. Patient tolerated the procedure very well. She was awakened from anesthesia, extubated and taken to recovery in stable condition.   Total surgical time was >2 hours.  Surgical assistant was needed due to complexity of this surgery.  Without endometriosis and adhesions, this procedure would typically take <1 hours.  COUNTS:  YES  PLAN OF CARE: Transfer to PACU

## 2018-06-28 ENCOUNTER — Encounter (HOSPITAL_BASED_OUTPATIENT_CLINIC_OR_DEPARTMENT_OTHER): Payer: Self-pay | Admitting: Obstetrics & Gynecology

## 2018-07-08 ENCOUNTER — Ambulatory Visit (INDEPENDENT_AMBULATORY_CARE_PROVIDER_SITE_OTHER): Payer: 59 | Admitting: Obstetrics & Gynecology

## 2018-07-08 ENCOUNTER — Encounter: Payer: Self-pay | Admitting: Obstetrics & Gynecology

## 2018-07-08 VITALS — BP 128/68 | HR 77 | Resp 16 | Ht 64.0 in | Wt 177.4 lb

## 2018-07-08 DIAGNOSIS — Z9889 Other specified postprocedural states: Secondary | ICD-10-CM

## 2018-07-08 NOTE — Progress Notes (Signed)
Post Operative Visit  Procedure: laparoscopic bilateral salpingectomy, Left oophorectomy, LOA  Days Post-op: 14  Subjective: Feels good.  Driving.  Went back to work on POD #3.  Had minimal bleeding.  Had minimal pain and took one narcotic tablet only.  Was able to travel and see family for Christmas.  Able to walk and ambulate easily.  Did have some post op bloating but that is improving.  Pathology and pictures reviewed.  Having regular bowel movements.  Emptying bladder normally.    Objective: BP 128/68   Pulse 77   Resp 16   Ht 5\' 4"  (1.626 m)   Wt 177 lb 6.4 oz (80.5 kg)   LMP 06/10/2018   BMI 30.45 kg/m   EXAM General: alert and no distress Resp: clear to auscultation bilaterally Cardio: regular rate and rhythm, S1, S2 normal, no murmur, click, rub or gallop GI: soft, non-tender; bowel sounds normal; no masses,  no organomegaly and incision: clean, dry and intact Extremities: extremities normal, atraumatic, no cyanosis or edema Vaginal Bleeding: none  Assessment: s/p laparoscopic bilateral salpingectomy, left oophorectomy, LOA  Plan: Return for AEX or prn new issues

## 2018-08-28 ENCOUNTER — Other Ambulatory Visit: Payer: Self-pay | Admitting: Family Medicine

## 2018-08-28 DIAGNOSIS — Z1231 Encounter for screening mammogram for malignant neoplasm of breast: Secondary | ICD-10-CM

## 2018-09-06 ENCOUNTER — Other Ambulatory Visit: Payer: Self-pay | Admitting: Nurse Practitioner

## 2018-09-06 NOTE — Telephone Encounter (Signed)
Pt advised RELPAX 40 MG tablet needs PA. Her insurance has not changed. Pt said [place] told her they have called the clinic twice. I advised her there is no documentation as we are to document each call . She has 1 tab.

## 2018-09-06 NOTE — Telephone Encounter (Addendum)
[employer], spoke with Lanelle Bal to initiate PA for Relpax. Dx: P59.458, Relpax approved x 12 months from today, PA #[id number]. Approval letter will be faxed to office.  [place], spoke with Opal Sidles and advised her of approval, gave her PA #. She verbalized understanding, appreciation.  I called the patient and advised her of approval. Patient verbalized understanding, appreciation.

## 2018-09-09 ENCOUNTER — Telehealth: Payer: Self-pay | Admitting: *Deleted

## 2018-09-09 NOTE — Telephone Encounter (Signed)
Received Fax from [place] approval for continued coverage of BN Relpax 09-06-18 thru 09-06-19.  PA# PPO [id number] NS.  [id number].

## 2018-09-12 DIAGNOSIS — Z1211 Encounter for screening for malignant neoplasm of colon: Secondary | ICD-10-CM | POA: Diagnosis not present

## 2018-09-12 DIAGNOSIS — Z131 Encounter for screening for diabetes mellitus: Secondary | ICD-10-CM | POA: Diagnosis not present

## 2018-09-12 DIAGNOSIS — Z0000 Encounter for general adult medical examination without abnormal findings: Secondary | ICD-10-CM | POA: Diagnosis not present

## 2018-09-24 ENCOUNTER — Ambulatory Visit: Payer: 59

## 2018-10-24 ENCOUNTER — Ambulatory Visit: Payer: 59

## 2018-12-10 ENCOUNTER — Telehealth: Payer: Self-pay | Admitting: *Deleted

## 2018-12-10 NOTE — Telephone Encounter (Signed)
Called pt. Due to current COVID 19 pandemic, our office is severely reducing in office visits until further notice, in order to minimize the risk to our patients and healthcare providers.  Pt understands that although there may be some limitations with this type of visit, we will take all precautions to reduce any security or privacy concerns.  Pt understands that this will be treated like an in office visit and we will file with pt's insurance, and there may be a patient responsible charge related to this service.  She consented to [email or web address] email sent to [email or web address].

## 2018-12-11 NOTE — Progress Notes (Signed)
° ° °  Virtual Visit via Video Note  I connected with Rachel Anthony on 12/12/18 at  7:45 AM EDT by a video enabled telemedicine application and verified that I am speaking with the correct person using two identifiers.  Location: Patient: At her home  Provider: In the office    I discussed the limitations of evaluation and management by telemedicine and the availability of in person appointments. The patient expressed understanding and agreed to proceed.  History of Present Illness: 12/12/2018 SS: Rachel Anthony is a 53 year old female with history of migraines since the 3rd grade.  She is currently taking Topamax 50 mg twice daily, BRAND Relpax as needed.  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  She reports she has been having a few migraines weekly.  She will take Excedrin Migraine or Relpax.  The Relpax works well, this is expensive, she only gets 6 tablets/month. She describes her migraine as occurring the left side, has photophobia, nausea.  Bright lights, smells, nitrates, exercise is a trigger for the headaches.  In the past she has tried Depakote, but had side effect of tremor.   12/07/2017 CM: Rachel Anthony, 53 year old female returns for yearly followup. She has history of migraines. .She has tried Depakote in the past but had side effects of significant tremor. She is currently on Topamax 50 mg twice daily tolerating the medication without side effects and her headache frequency is 1-2  headaches per month or less. She takes Relpax BRAND acutely with relief . Her headaches continue to be unilateral and frontotemporal and can be associated with nausea and vomiting, photophobia, and photosensitivity. She has not missed any work due to her headaches. 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 No new medical issues since last seen. She also has seasonal allergies that cause  some of her headaches. She returns for reevaluation   Observations/Objective: Very pleasant, is alert and oriented, answers questions appropriately, facial symmetry noted, speech is clear and concise, facial symmetry noted, no arm drift, gait is intact  Assessment and Plan: 1.  Migraine headaches  She has a long history of migraine headaches. 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  We discussed options to increase her Topamax or try CGRP, Aimovig.  At this time she does not wish to make any changes, she will let me know if she changes her mind.  I will send in a refill of Topamax and BRAND Relpax.  She will follow-up in 1 year or sooner if needed.  Follow Up Instructions: 1 year 12/17/2019 7:45 am   I discussed the assessment and treatment plan with the patient. The patient was provided an opportunity to ask questions and all were answered. The patient agreed with the plan and demonstrated an understanding of the instructions.   The patient was advised to call back or seek an in-person evaluation if the symptoms worsen or if the condition fails to improve as anticipated.  I provided 15 minutes of non-face-to-face time during this encounter.  Evangeline Dakin, DNP  [facility] [address], [place], [place] [address] [phone]

## 2018-12-12 ENCOUNTER — Other Ambulatory Visit: Payer: Self-pay

## 2018-12-12 ENCOUNTER — Ambulatory Visit: Payer: 59 | Admitting: Nurse Practitioner

## 2018-12-12 ENCOUNTER — Ambulatory Visit (INDEPENDENT_AMBULATORY_CARE_PROVIDER_SITE_OTHER): Payer: 59 | Admitting: Neurology

## 2018-12-12 ENCOUNTER — Encounter: Payer: Self-pay | Admitting: Neurology

## 2018-12-12 DIAGNOSIS — G43009 Migraine without aura, not intractable, without status migrainosus: Secondary | ICD-10-CM | POA: Diagnosis not present

## 2018-12-12 MED ORDER — TOPIRAMATE 25 MG PO TABS: 50.0000 mg | ORAL_TABLET | Freq: Two times a day (BID) | ORAL | 11 refills | Status: DC

## 2018-12-12 MED ORDER — RELPAX 40 MG PO TABS: 40.0000 mg | ORAL_TABLET | ORAL | 11 refills | Status: DC | PRN

## 2018-12-12 NOTE — Progress Notes (Signed)
I have read the note, and I agree with the clinical assessment and plan.  Charles K Willis   

## 2018-12-13 ENCOUNTER — Other Ambulatory Visit: Payer: Self-pay

## 2018-12-13 ENCOUNTER — Ambulatory Visit
Admission: RE | Admit: 2018-12-13 | Discharge: 2018-12-13 | Disposition: A | Discharge status: Home / Self Care | Payer: 59 | Source: Ambulatory Visit | Attending: Family Medicine | Admitting: Family Medicine

## 2018-12-13 DIAGNOSIS — Z1231 Encounter for screening mammogram for malignant neoplasm of breast: Secondary | ICD-10-CM

## 2019-01-15 ENCOUNTER — Other Ambulatory Visit: Payer: Self-pay

## 2019-01-17 ENCOUNTER — Other Ambulatory Visit (HOSPITAL_COMMUNITY)
Admission: RE | Admit: 2019-01-17 | Discharge: 2019-01-17 | Disposition: A | Discharge status: Home / Self Care | Payer: 59 | Source: Ambulatory Visit | Attending: Obstetrics & Gynecology | Admitting: Obstetrics & Gynecology

## 2019-01-17 ENCOUNTER — Ambulatory Visit (INDEPENDENT_AMBULATORY_CARE_PROVIDER_SITE_OTHER): Payer: 59 | Admitting: Obstetrics & Gynecology

## 2019-01-17 ENCOUNTER — Other Ambulatory Visit: Payer: Self-pay

## 2019-01-17 ENCOUNTER — Encounter: Payer: Self-pay | Admitting: Obstetrics & Gynecology

## 2019-01-17 VITALS — BP 126/72 | HR 84 | Temp 97.3°F | Ht 65.0 in | Wt 188.0 lb

## 2019-01-17 DIAGNOSIS — D251 Intramural leiomyoma of uterus: Secondary | ICD-10-CM | POA: Diagnosis not present

## 2019-01-17 DIAGNOSIS — D252 Subserosal leiomyoma of uterus: Secondary | ICD-10-CM | POA: Diagnosis not present

## 2019-01-17 DIAGNOSIS — Z124 Encounter for screening for malignant neoplasm of cervix: Secondary | ICD-10-CM

## 2019-01-17 DIAGNOSIS — N912 Amenorrhea, unspecified: Secondary | ICD-10-CM | POA: Diagnosis not present

## 2019-01-17 DIAGNOSIS — Z01411 Encounter for gynecological examination (general) (routine) with abnormal findings: Secondary | ICD-10-CM

## 2019-01-17 NOTE — Progress Notes (Signed)
53 y.o. G21P1001 Divorced White or Caucasian female here for annual exam.  Last cycle was in January about a month after her surgery.  She is having some mild heat intolerance.     Patient's last menstrual period was 08/01/2018 (approximate).          Sexually active: No.  The current method of family planning is abstinence.    Exercising: Yes.    walking, aerobics  Smoker:  no  Health Maintenance: Pap:  12/21/15 Neg. HR HPV:neg   12/11/13 Neg  History of abnormal Pap:  no MMG:  12/13/18 BIRADS1:neg Colonoscopy:  2009.  This is due.  Referral was done by Dr. Orland Mustard.  This is planned for December. BMD:   Never TDaP:  08/17/2011 Pneumonia vaccine(s):  n/a Shingrix:   Completed  Hep C testing: No Screening Labs: PCP   reports that she has never smoked. She has never used smokeless tobacco. She reports that she does not drink alcohol or use drugs.  Past Medical History:  Diagnosis Date  . Migraines    no aura  . Sinus problem   . STD (sexually transmitted disease)    HSV 2    Past Surgical History:  Procedure Laterality Date  . CESAREAN SECTION  2000  . CYSTOSCOPY  06/24/2018   Procedure: CYSTOSCOPY;  Surgeon: Megan Salon, MD;  Location: [facility];  Service: Gynecology;;  . LAPAROSCOPIC OVARIAN CYSTECTOMY Left 06/24/2018   Procedure: LAPAROSCOPIC OOPHERECTOMY LYSIS OF ADHESIONS;  Surgeon: Megan Salon, MD;  Location[facility];  Service: Gynecology;  Laterality: Left;  . LAPAROSCOPIC UNILATERAL SALPINGECTOMY Right 06/24/2018   Procedure: LAPAROSCOPIC BILATERAL SALPINGECTOMY;  Surgeon: Megan Salon, MD;  Location[facility];  Service: Gynecology;  Laterality: Right;  . POLYPECTOMY  2010    Current Outpatient Medications  Medication Sig Dispense Refill  . loratadine (CLARITIN) 10 MG tablet Take 10 mg by mouth as needed for allergies.    . RELPAX 40 MG tablet Take 1 tablet (40 mg total) by mouth as needed for migraine. May  repeat in 2 hours if headache persists or recurs. (90 day Rx) 6 tablet 11  . topiramate (TOPAMAX) 25 MG tablet Take 2 tablets (50 mg total) by mouth 2 (two) times daily. 120 tablet 11  . vitamin C (ASCORBIC ACID) 500 MG tablet Take 500 mg by mouth daily.     No current facility-administered medications for this visit.     Family History  Problem Relation Age of Onset  . Hypertension Mother   . Cancer - Colon Paternal Grandmother   . Heart disease Maternal Grandmother     Review of Systems  All other systems reviewed and are negative.   Exam:   BP 126/72   Pulse 84   Temp (!) 97.3 F (36.3 C) (Temporal)   Ht 5\' 5"  (1.651 m)   Wt 188 lb (85.3 kg)   LMP 08/01/2018 (Approximate)   BMI 31.28 kg/m   Height: 5\' 5"  (165.1 cm)  Ht Readings from Last 3 Encounters:  01/17/19 5\' 5"  (1.651 m)  07/08/18 5\' 4"  (1.626 m)  06/24/18 5' 4.5" (1.638 m)    General appearance: alert, cooperative and appears stated age Head: Normocephalic, without obvious abnormality, atraumatic Neck: no adenopathy, supple, symmetrical, trachea midline and thyroid normal to inspection and palpation Lungs: clear to auscultation bilaterally Breasts: normal appearance, no masses or tenderness Heart: regular rate and rhythm Abdomen: soft, non-tender; bowel sounds normal; no masses,  no organomegaly Extremities: extremities normal, atraumatic, no cyanosis or edema Skin: Skin color, texture, turgor normal. No rashes or lesions Lymph nodes: Cervical, supraclavicular, and axillary nodes normal. No abnormal inguinal nodes palpated Neurologic: Grossly normal   Pelvic: External genitalia:  no lesions              Urethra:  normal appearing urethra with no masses, tenderness or lesions              Bartholins and Skenes: normal                 Vagina: normal appearing vagina with normal color and discharge, no lesions              Cervix: no lesions              Pap taken: Yes.   Bimanual Exam:  Uterus:  normal  size, contour, position, consistency, mobility, non-tender              Adnexa: normal adnexa and no mass, fullness, tenderness               Rectovaginal: Confirms               Anus:  normal sphincter tone, no lesions  Chaperone was present for exam.  A:  Well Woman with normal exam Amenorrhea since January H/o menorrhagia and dysmenorrhea Migraines Uterine fibroids   P:   Mammogram guidelines reviewed.  This is UTD. pap smear with HR HPV obtained today Lab work done with Dr. Orland Mustard Planning colonoscopy in December [facility] obtained today (was 9) Return annually or prn

## 2019-01-18 LAB — FOLLICLE STIMULATING HORMONE: FSH: 70.7 m[IU]/mL

## 2019-01-21 LAB — CYTOLOGY - PAP
Diagnosis: NEGATIVE
HPV: NOT DETECTED

## 2019-04-11 DIAGNOSIS — Z23 Encounter for immunization: Secondary | ICD-10-CM | POA: Diagnosis not present

## 2019-06-23 DIAGNOSIS — Z20828 Contact with and (suspected) exposure to other viral communicable diseases: Secondary | ICD-10-CM | POA: Diagnosis not present

## 2019-07-30 DIAGNOSIS — Z1159 Encounter for screening for other viral diseases: Secondary | ICD-10-CM | POA: Diagnosis not present

## 2019-08-04 DIAGNOSIS — Z1211 Encounter for screening for malignant neoplasm of colon: Secondary | ICD-10-CM | POA: Diagnosis not present

## 2019-09-18 DIAGNOSIS — Z0000 Encounter for general adult medical examination without abnormal findings: Secondary | ICD-10-CM | POA: Diagnosis not present

## 2019-09-18 DIAGNOSIS — E785 Hyperlipidemia, unspecified: Secondary | ICD-10-CM | POA: Diagnosis not present

## 2019-09-22 ENCOUNTER — Encounter: Payer: Self-pay | Admitting: Certified Nurse Midwife

## 2019-12-17 ENCOUNTER — Ambulatory Visit: Payer: 59 | Admitting: Neurology

## 2019-12-18 ENCOUNTER — Other Ambulatory Visit: Payer: Self-pay | Admitting: *Deleted

## 2019-12-18 MED ORDER — RELPAX 40 MG PO TABS: 40.0000 mg | ORAL_TABLET | ORAL | 1 refills | Status: DC | PRN

## 2019-12-19 ENCOUNTER — Other Ambulatory Visit: Payer: Self-pay | Admitting: Neurology

## 2019-12-30 ENCOUNTER — Other Ambulatory Visit: Payer: Self-pay | Admitting: Family Medicine

## 2019-12-30 DIAGNOSIS — Z1231 Encounter for screening mammogram for malignant neoplasm of breast: Secondary | ICD-10-CM

## 2020-01-07 ENCOUNTER — Ambulatory Visit
Admission: RE | Admit: 2020-01-07 | Discharge: 2020-01-07 | Disposition: A | Discharge status: Home / Self Care | Payer: 59 | Source: Ambulatory Visit | Attending: Family Medicine | Admitting: Family Medicine

## 2020-01-07 ENCOUNTER — Other Ambulatory Visit: Payer: Self-pay

## 2020-01-07 DIAGNOSIS — Z1231 Encounter for screening mammogram for malignant neoplasm of breast: Secondary | ICD-10-CM | POA: Diagnosis not present

## 2020-01-19 ENCOUNTER — Encounter: Payer: Self-pay | Admitting: Neurology

## 2020-01-19 ENCOUNTER — Ambulatory Visit (INDEPENDENT_AMBULATORY_CARE_PROVIDER_SITE_OTHER): Payer: 59 | Admitting: Neurology

## 2020-01-19 ENCOUNTER — Other Ambulatory Visit: Payer: Self-pay

## 2020-01-19 VITALS — BP 133/78 | HR 61 | Wt 159.0 lb

## 2020-01-19 DIAGNOSIS — G43009 Migraine without aura, not intractable, without status migrainosus: Secondary | ICD-10-CM | POA: Diagnosis not present

## 2020-01-19 MED ORDER — RELPAX 40 MG PO TABS: 40.0000 mg | ORAL_TABLET | ORAL | 3 refills | Status: DC | PRN

## 2020-01-19 MED ORDER — AJOVY 225 MG/1.5ML ~~LOC~~ SOAJ: 225.0000 mg | SUBCUTANEOUS | 11 refills | Status: DC

## 2020-01-19 MED ORDER — TOPIRAMATE 25 MG PO TABS: ORAL_TABLET | ORAL | 5 refills | Status: DC

## 2020-01-19 NOTE — Progress Notes (Signed)
PATIENT: Rachel Anthony DOB: 09-Jul-1965  REASON FOR VISIT: follow up HISTORY FROM: patient  HISTORY OF PRESENT ILLNESS: Today 01/19/20  Rachel Anthony 54 year old female with history of migraines.  She is on Topamax 50 mg twice a day, brand-name Relpax as needed.  Has had an increase in headaches due to work related stress.  She is a Designer, fashion/clothing.  On average, 1-2 headaches a week, 1-2 migraines a month, headaches come in clusters, could have headache for several days that relpax won't touch.  She has previously tried Depakote, amitriptyline, is currently on Topamax.  Does have mild side effect of Topamax with brain fog.  Relpax is effective usually, but is expensive over $100 a month for 6 tablets, uses card, prices goes up subsequent months.  Some months, she takes her entire supply.  For mild headaches, takes Excedrin Migraine she is careful with diet to avoid food triggers, no soft drinks.  Overall health is good.  Presents today for evaluation unaccompanied.  HISTORY 12/12/2018 SS: Rachel Anthony is a 53 year old female with history of migraines since the 3rd grade.  She is currently taking Topamax 50 mg twice daily, BRAND Relpax as needed.  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  She reports she has been having a few migraines weekly.  She will take Excedrin Migraine or Relpax.  The Relpax works well, this is expensive, she only gets 6 tablets/month. She describes her migraine as occurring the left side, has photophobia, nausea.  Bright lights, smells, nitrates, exercise is a trigger for the headaches.  In the past she has tried Depakote, but had side effect of tremor.    REVIEW OF SYSTEMS: Out of a complete 14 system review of symptoms, the patient complains only of the following symptoms, and all other reviewed systems are  negative.  Headache  ALLERGIES: No Known Allergies  HOME MEDICATIONS: Outpatient Medications Prior to Visit  Medication Sig Dispense Refill   loratadine (CLARITIN) 10 MG tablet Take 10 mg by mouth as needed for allergies.     RELPAX 40 MG tablet Take 1 tablet (40 mg total) by mouth as needed for migraine. May repeat in 2 hours if headache persists or recurs. (90 day Rx) 6 tablet 1   topiramate (TOPAMAX) 25 MG tablet TAKE (2) TABLETS TWICE DAILY. 120 tablet 0   vitamin C (ASCORBIC ACID) 500 MG tablet Take 500 mg by mouth daily.     No facility-administered medications prior to visit.    PAST MEDICAL HISTORY: Past Medical History:  Diagnosis Date   Migraines    no aura   Sinus problem    STD (sexually transmitted disease)    HSV 2    PAST SURGICAL HISTORY: Past Surgical History:  Procedure Laterality Date   CESAREAN SECTION  2000   CYSTOSCOPY  06/24/2018   Procedure: CYSTOSCOPY;  Surgeon: Megan Salon, MD;  Location: [facility];  Service: Gynecology;;   LAPAROSCOPIC OVARIAN CYSTECTOMY Left 06/24/2018   Procedure: LAPAROSCOPIC OOPHERECTOMY LYSIS OF ADHESIONS;  Surgeon: Megan Salon, MD;  [facility];  Service: Gynecology;  Laterality: Left;   LAPAROSCOPIC UNILATERAL SALPINGECTOMY Right 06/24/2018   Procedure: LAPAROSCOPIC BILATERAL SALPINGECTOMY;  Surgeon: Megan Salon, MD;  [facility];  Service: Gynecology;  Laterality: Right;   POLYPECTOMY  2010  FAMILY HISTORY: Family History  Problem Relation Age of Onset   Hypertension Mother    Cancer - Colon Paternal Grandmother    Heart disease Maternal Grandmother     SOCIAL HISTORY: Social History   Socioeconomic History   Marital status: Divorced    Spouse name: Not on file   Number of children: 1   Years of education: 12+   Highest education level: Not on file  Occupational History    Employer: [employer] [place]  Tobacco Use   Smoking status: Never Smoker   Smokeless tobacco: Never Used  Vaping Use   Vaping Use: Never used  Substance and Sexual Activity   Alcohol use: No   Drug use: No   Sexual activity: Not Currently    Partners: Male    Birth control/protection: Abstinence  Other Topics Concern   Not on file  Social History Narrative   Patient works as a Education officer, 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 The patient is divorced and lives with her daughter.    Social Determinants of Health   Financial Resource Strain:    Difficulty of Paying Living Expenses:   Food Insecurity:    Worried About Charity fundraiser in the Last Year:    Arboriculturist in the Last Year:   Transportation Needs:    Film/video editor (Medical):    Lack of Transportation (Non-Medical):   Physical Activity:    Days of Exercise per Week:    Minutes of Exercise per Session:   Stress:    Feeling of Stress :   Social Connections:    Frequency of Communication with Friends and Family:    Frequency of Social Gatherings with Friends and Family:    Attends Religious Services:    Active Member of Clubs or Organizations:    Attends Archivist Meetings:    Marital Status:   Intimate Partner Violence:    Fear of Current or Ex-Partner:    Emotionally Abused:    Physically Abused:    Sexually Abused:    PHYSICAL EXAM  Vitals:   01/19/20 1235  BP: 133/78  Pulse: 61  Weight: 159 lb (72.1 kg)   Body mass index is 26.46 kg/m.  Generalized: Well developed, in no acute distress   Neurological examination  Mentation: Alert oriented to time, place, history taking. Follows all commands speech and language fluent Cranial nerve II-XII: Pupils were equal round reactive to light. Extraocular movements were full, visual field were full on confrontational test. Facial sensation and strength were normal. Head turning and  shoulder shrug  were normal and symmetric. Motor: The motor testing reveals 5 over 5 strength of all 4 extremities. Good symmetric motor tone is noted throughout.  Sensory: Sensory testing is intact to soft touch on all 4 extremities. No evidence of extinction is noted.  Coordination: Cerebellar testing reveals good finger-nose-finger and heel-to-shin bilaterally.  Gait and station: Gait is normal.   Reflexes: Deep tendon reflexes are symmetric and normal bilaterally.   DIAGNOSTIC DATA (LABS, IMAGING, TESTING) - I reviewed patient records, labs, notes, testing and imaging myself where available.  Lab Results  Component Value Date   WBC 7.2 06/20/2018   HGB 13.7 06/20/2018   HCT 44.1 06/20/2018   MCV 96.5 06/20/2018   PLT 194 06/20/2018   No results found for: NA, K, CL, CO2, GLUCOSE, BUN, CREATININE, CALCIUM, PROT, ALBUMIN, AST, ALT, ALKPHOS, BILITOT,  GFRNONAA, GFRAA No results found for: CHOL, HDL, LDLCALC, LDLDIRECT, TRIG, CHOLHDL No results found for: HGBA1C No results found for: VITAMINB12 Lab Results  Component Value Date   TSH 2.110 01/10/2018      ASSESSMENT AND PLAN 54 y.o. year old female  has a past medical history of Migraines, Sinus problem, and STD (sexually transmitted disease). here with:  1.  Chronic migraine headaches  Of lately, has had an increase in headaches.  We will try Ajovy 225 mg monthly injection for migraine prevention.  For now, she will remain on Topamax 50 mg twice a day, didn't increase dose due to experiencing some side effect.  She requires brand-name Relpax, 6 tablets, is over $100. Hopefully, Ajovy will decrease the amount of rescue medication she requires, may also try Nurtec or Ubrelvy in the future.  She has previously tried and failed amitriptyline and Depakote.  She will follow-up in 4 months to determine benefit of CGRP or sooner if needed.  I spent 30 minutes of face-to-face and non-face-to-face time with patient.  This included previsit  chart review, lab review, study review, order entry, electronic health record documentation, patient education.  Butler Denmark, AGNP-C, DNP 01/19/2020, 1:27 PM [facility] [address] [place], [place] [address] [phone]

## 2020-01-19 NOTE — Progress Notes (Signed)
I have read the note, and I agree with the clinical assessment and plan.  Filippa Yarbough K Ellias Mcelreath   

## 2020-01-19 NOTE — Patient Instructions (Addendum)
Let's try Ajovy for migraine prevention, 225 mg monthly injection  Continue Topamax at current dosing Continue Relpax as needed  Fremanezumab injection What is this medicine? FREMANEZUMAB (fre ma NEZ ue mab) is used to prevent migraine headaches. This medicine may be used for other purposes; ask your health care provider or pharmacist if you have questions. COMMON BRAND NAME(S): AJOVY What should I tell my health care provider before I take this medicine? They need to know if you have any of these conditions:  an unusual or allergic reaction to fremanezumab, other medicines, foods, dyes, or preservatives  pregnant or trying to get pregnant  breast-feeding How should I use this medicine? This medicine is for injection under the skin. You will be taught how to prepare and give this medicine. Use exactly as directed. Take your medicine at regular intervals. Do not take your medicine more often than directed. It is important that you put your used needles and syringes in a special sharps container. Do not put them in a trash can. If you do not have a sharps container, call your pharmacist or healthcare provider to get one. Talk to your pediatrician regarding the use of this medicine in children. Special care may be needed. Overdosage: If you think you have taken too much of this medicine contact a poison control center or emergency room at once. NOTE: This medicine is only for you. Do not share this medicine with others. What if I miss a dose? If you miss a dose, take it as soon as you can. If it is almost time for your next dose, take only that dose. Do not take double or extra doses. What may interact with this medicine? Interactions are not expected. This list may not describe all possible interactions. Give your health care provider a list of all the medicines, herbs, non-prescription drugs, or dietary supplements you use. Also tell them if you smoke, drink alcohol, or use illegal drugs.  Some items may interact with your medicine. What should I watch for while using this medicine? Tell your doctor or healthcare professional if your symptoms do not start to get better or if they get worse. What side effects may I notice from receiving this medicine? Side effects that you should report to your doctor or health care professional as soon as possible:  allergic reactions like skin rash, itching or hives, swelling of the face, lips, or tongue Side effects that usually do not require medical attention (report these to your doctor or health care professional if they continue or are bothersome):  pain, redness, or irritation at site where injected This list may not describe all possible side effects. Call your doctor for medical advice about side effects. You may report side effects to FDA at 1-800-FDA-1088. Where should I keep my medicine? Keep out of the reach of children. You will be instructed on how to store this medicine. Throw away any unused medicine after the expiration date on the label. NOTE: This sheet is a summary. It may not cover all possible information. If you have questions about this medicine, talk to your doctor, pharmacist, or health care provider.  2020 Elsevier/Gold Standard (2017-03-19 17:22:56)

## 2020-03-19 NOTE — Progress Notes (Signed)
54 y.o. G72P1001 Divorced White or Caucasian female here for annual exam.  Denies vaginal bleeding since 07/2018.  Having some mild heat intolerance.    PCP:  Dr. Orland Mustard.  Last appt was 09/2019  Patient's last menstrual period was 08/01/2018 (approximate).          Sexually active: No.  The current method of family planning is post menopausal status.    Exercising: Yes.    walking Smoker:  no  Health Maintenance: Pap:  12-21-15 neg HPV HR neg, 01-17-2019 neg HPV HR neg History of abnormal Pap:  no MMG:  01-08-2020 category c density birads 1:neg Colonoscopy:  2021 f/u in 10 yrs per patient BMD:   none  TDaP:  2013 Pneumonia vaccine(s):  no Shingrix:   Done 2020 Hep C testing: not done Screening Labs: 09/2019 with Dr. Orland Mustard   reports that she has never smoked. She has never used smokeless tobacco. She reports that she does not drink alcohol and does not use drugs.  Past Medical History:  Diagnosis Date  . Migraines    no aura  . Sinus problem   . STD (sexually transmitted disease)    HSV 2    Past Surgical History:  Procedure Laterality Date  . CESAREAN SECTION  2000  . CYSTOSCOPY  06/24/2018   Procedure: CYSTOSCOPY;  Surgeon: Megan Salon, MD;  Location: [facility];  Service: Gynecology;;  . LAPAROSCOPIC OVARIAN CYSTECTOMY Left 06/24/2018   Procedure: LAPAROSCOPIC OOPHERECTOMY LYSIS OF ADHESIONS;  Surgeon: Megan Salon, MD;  Location: [facility];  Service: Gynecology;  Laterality: Left;  . LAPAROSCOPIC UNILATERAL SALPINGECTOMY Right 06/24/2018   Procedure: LAPAROSCOPIC BILATERAL SALPINGECTOMY;  Surgeon: Megan Salon, MD;  Location: [facility];  Service: Gynecology;  Laterality: Right;  . POLYPECTOMY  2010    Current Outpatient Medications  Medication Sig Dispense Refill  . loratadine (CLARITIN) 10 MG tablet Take 10 mg by mouth as needed for allergies.    . RELPAX 40 MG tablet Take 1 tablet (40 mg total) by mouth as needed  for migraine. May repeat in 2 hours if headache persists or recurs. 6 tablet 3  . topiramate (TOPAMAX) 25 MG tablet TAKE (2) TABLETS TWICE DAILY. 120 tablet 5   No current facility-administered medications for this visit.    Family History  Problem Relation Age of Onset  . Hypertension Mother   . Cancer - Colon Paternal Grandmother   . Heart disease Maternal Grandmother     Review of Systems  Constitutional: Negative.   HENT: Negative.   Eyes: Negative.   Respiratory: Negative.   Cardiovascular: Negative.   Gastrointestinal: Negative.   Endocrine: Negative.   Genitourinary: Negative.   Musculoskeletal: Negative.   Skin: Negative.   Allergic/Immunologic: Negative.   Neurological: Negative.   Hematological: Negative.   Psychiatric/Behavioral: Negative.     Exam:   BP 110/70   Pulse 68   Resp 16   Ht 5' 4.75" (1.645 m)   Wt 150 lb (68 kg)   LMP 08/01/2018 (Approximate)   BMI 25.15 kg/m   Height: 5' 4.75" (164.5 cm)  General appearance: alert, cooperative and appears stated age Head: Normocephalic, without obvious abnormality, atraumatic Neck: no adenopathy, supple, symmetrical, trachea midline and thyroid normal to inspection and palpation Lungs: clear to auscultation bilaterally Breasts: normal appearance, no masses or tenderness Heart: regular rate and rhythm Abdomen: soft, non-tender; bowel sounds normal; no masses,  no organomegaly Extremities: extremities normal, atraumatic, no cyanosis  or edema Skin: Skin color, texture, turgor normal. No rashes or lesions Lymph nodes: Cervical, supraclavicular, and axillary nodes normal. No abnormal inguinal nodes palpated Neurologic: Grossly normal   Pelvic: External genitalia:  no lesions              Urethra:  normal appearing urethra with no masses, tenderness or lesions              Bartholins and Skenes: normal                 Vagina: normal appearing vagina with normal color and discharge, no lesions               Cervix: no lesions              Pap taken: No. Bimanual Exam:  Uterus:  normal size, contour, position, consistency, mobility, non-tender              Adnexa: normal adnexa and no mass, fullness, tenderness               Rectovaginal: Confirms               Anus:  normal sphincter tone, no lesions  Chaperone, Terence Lux, CMA, was present for exam.  A:  Well Woman with normal exam PMP, no HRT H/o right salpingectomy/LSO 06/2018 Migraines  Uterine fibroids  P:   Mammogram guidelines reviewed.  Doing yearly. pap smear neg with neg HR HPV 2019 Release for colonoscopy signed today Lab work done earlier this year with Dr. Orland Mustard. return annually or prn

## 2020-03-23 ENCOUNTER — Other Ambulatory Visit: Payer: Self-pay

## 2020-03-23 ENCOUNTER — Encounter: Payer: Self-pay | Admitting: Obstetrics & Gynecology

## 2020-03-23 ENCOUNTER — Ambulatory Visit (INDEPENDENT_AMBULATORY_CARE_PROVIDER_SITE_OTHER): Payer: 59 | Admitting: Obstetrics & Gynecology

## 2020-03-23 VITALS — BP 110/70 | HR 68 | Resp 16 | Ht 64.75 in | Wt 150.0 lb

## 2020-03-23 DIAGNOSIS — Z01419 Encounter for gynecological examination (general) (routine) without abnormal findings: Secondary | ICD-10-CM | POA: Diagnosis not present

## 2020-05-10 ENCOUNTER — Ambulatory Visit: Payer: 59 | Admitting: Neurology

## 2020-09-04 DIAGNOSIS — Z20822 Contact with and (suspected) exposure to covid-19: Secondary | ICD-10-CM | POA: Diagnosis not present

## 2020-12-08 ENCOUNTER — Other Ambulatory Visit: Payer: Self-pay | Admitting: Orthopedic Surgery

## 2020-12-08 DIAGNOSIS — Z1231 Encounter for screening mammogram for malignant neoplasm of breast: Secondary | ICD-10-CM

## 2020-12-20 ENCOUNTER — Other Ambulatory Visit: Payer: Self-pay | Admitting: *Deleted

## 2020-12-20 ENCOUNTER — Telehealth: Payer: Self-pay | Admitting: Neurology

## 2020-12-20 DIAGNOSIS — G43009 Migraine without aura, not intractable, without status migrainosus: Secondary | ICD-10-CM

## 2020-12-20 MED ORDER — RELPAX 40 MG PO TABS: 40.0000 mg | ORAL_TABLET | ORAL | 3 refills | Status: DC | PRN

## 2020-12-20 NOTE — Telephone Encounter (Signed)
Pt is asking for a call from RN to discuss what needs to be done so she is able to get her  RELPAX 40 MG tablet

## 2020-12-20 NOTE — Telephone Encounter (Signed)
Pt returned phone call. Have changed insurance, they need approval from the physician. Need Relpax brand name instead of the generic so insurance will pay for it.

## 2020-12-20 NOTE — Telephone Encounter (Signed)
Called pt. ID for Rachel Anthony was correct that pharmacy provided. ID: [id number]. Insurance effective 10/01/20. CMM stating: "Invalid response" and will not let me submit PA. I was able to add "00" at the end of ID and CMM was able to locate pt. Submitted PA on CMM. KeyKayleen Memos - PA Case ID: [id number]. Waiting on determination from [place].

## 2020-12-20 NOTE — Telephone Encounter (Addendum)
Pt was due for refill, e-scribed name brand to Rachel Anthony & Rachel Anthony on file. [place]. She now has [place]. ID: [id number] RXBIN: [id number]  NATFT: [id number] RXGRP: [id number] Phone# [phone]

## 2020-12-20 NOTE — Telephone Encounter (Signed)
LVM returning pt call. Please find out what she is needing in regards to her relpax. Does she need a refill?

## 2020-12-22 MED ORDER — RELPAX 40 MG PO TABS: 40.0000 mg | ORAL_TABLET | ORAL | 3 refills | Status: DC | PRN

## 2020-12-22 NOTE — Telephone Encounter (Signed)
PA brand Relpax denied. They want pt to try/fail 4 total formulary options before coverage. She has tried and failed sumatriptan/rizatriptan. Other formulary options: eletriptan, almotriptan, frovatriptan, naratriptan, zolmitriptan.   I called pt. Relayed above message. She would prefer to stay on Brand Relpax and use goodrx coupon to fill rx at [facility] for about 26.49 #6 tabs/30days. E-scribed rx. She will call if she has any more questions/concerns.

## 2020-12-22 NOTE — Addendum Note (Signed)
Addended by: Wyvonnia Lora on: 12/22/2020 04:57 PM   Modules accepted: Orders

## 2020-12-27 ENCOUNTER — Telehealth: Payer: Self-pay | Admitting: Neurology

## 2020-12-27 DIAGNOSIS — G43009 Migraine without aura, not intractable, without status migrainosus: Secondary | ICD-10-CM

## 2020-12-27 MED ORDER — RELPAX 40 MG PO TABS: 40.0000 mg | ORAL_TABLET | ORAL | 3 refills | Status: DC | PRN

## 2020-12-27 NOTE — Telephone Encounter (Signed)
Spoke to pt and she would like to go to [facility] (she has used this pharmacy previously will try using the copay savings card) Brand Name Relpax. [facility] price she was given was for generic relpax and she would like BRAND. She appreciated call back.  I did redid rx to [facility].

## 2020-12-27 NOTE — Telephone Encounter (Signed)
I called pt and LMVM for her to return call about brand name RELPAX cost. I know this was addressed previously and pt wanted RELPAX with goodrx card.  Will wait to hear back from pt prior to call [place].

## 2020-12-27 NOTE — Telephone Encounter (Signed)
[place] Marye Round) called brand name for RELPAX 40 MG tablet is expensive. Asking if physician approve changing to generic. Would like a call from the nurse.

## 2021-01-19 NOTE — Progress Notes (Signed)
PATIENT: Rachel Anthony DOB: 12-23-1965  REASON FOR VISIT: follow up HISTORY FROM: patient Primary Neurologist: Dr. Jannifer Franklin   HISTORY OF PRESENT ILLNESS: Today 01/20/21  Rachel Anthony here today to follow-up on migraines. 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 Remains on Topamax 50 mg twice daily. Insurance wouldn't cover [place]. Is on brand name Relpax, $350, insurance only allow 6 tablets, wants to try generic. Rarely has to take 2nd. On average having 2-3 times a week having migraines, working a lot of overtime.    Update 01/19/2020 SS: Rachel Anthony 55 year old female with history of migraines.  She is on Topamax 50 mg twice a day, brand-name Relpax as needed.  Has had an increase in headaches due to work related stress.  She is a Designer, fashion/clothing.  On average, 1-2 headaches a week, 1-2 migraines a month, headaches come in clusters, could have headache for several days that relpax won't touch.  She has previously tried Depakote, amitriptyline, is currently on Topamax.  Does have mild side effect of Topamax with brain fog.  Relpax is effective usually, but is expensive over $100 a month for 6 tablets, uses card, prices goes up subsequent months.  Some months, she takes her entire supply.  For mild headaches, takes Excedrin Migraine she is careful with diet to avoid food triggers, no soft drinks.  Overall health is good.  Presents today for evaluation unaccompanied.  HISTORY 12/12/2018 SS: Rachel Anthony is a 55 year old female with history of migraines since the 3rd grade.  She is currently taking Topamax 50 mg twice daily, BRAND Relpax as needed.  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  She reports she has been having a few migraines weekly.  She will take  Excedrin Migraine or Relpax.  The Relpax works well, this is expensive, she only gets 6 tablets/month. She describes her migraine as occurring the left side, has photophobia, nausea.  Bright lights, smells, nitrates, exercise is a trigger for the headaches.  In the past she has tried Depakote, but had side effect of tremor.    REVIEW OF SYSTEMS: Out of a complete 14 system review of symptoms, the patient complains only of the following symptoms, and all other reviewed systems are negative.  Headache  ALLERGIES: No Known Allergies  HOME MEDICATIONS: Outpatient Medications Prior to Visit  Medication Sig Dispense Refill   loratadine (CLARITIN) 10 MG tablet Take 10 mg by mouth as needed for allergies.     RELPAX 40 MG tablet Take 1 tablet (40 mg total) by mouth as needed for migraine. May repeat in 2 hours if headache persists or recurs. 6 tablet 3   topiramate (TOPAMAX) 25 MG tablet TAKE (2) TABLETS TWICE DAILY. 120 tablet 5   No facility-administered medications prior to visit.    PAST MEDICAL HISTORY: Past Medical History:  Diagnosis Date   Migraines    no aura   Sinus problem    STD (sexually transmitted disease)    HSV 2    PAST SURGICAL HISTORY: Past Surgical History:  Procedure Laterality Date   CESAREAN SECTION  2000   CYSTOSCOPY  06/24/2018   Procedure: CYSTOSCOPY;  Surgeon: Megan Salon, MD;  Location: [facility];  Service: Gynecology;;  LAPAROSCOPIC OVARIAN CYSTECTOMY Left 06/24/2018   Procedure: LAPAROSCOPIC OOPHERECTOMY LYSIS OF ADHESIONS;  Surgeon: Megan Salon, MD;  Location: [facility];  Service: Gynecology;  Laterality: Left;   LAPAROSCOPIC UNILATERAL SALPINGECTOMY Right 06/24/2018   Procedure: LAPAROSCOPIC BILATERAL SALPINGECTOMY;  Surgeon: Megan Salon, MD;  Location: [facility];  Service: Gynecology;  Laterality: Right;   POLYPECTOMY  2010    FAMILY HISTORY: Family History  Problem Relation Age of Onset    Hypertension Mother    Colon cancer Paternal Grandmother    Heart disease Maternal Grandmother     SOCIAL HISTORY: Social History   Socioeconomic History   Marital status: Divorced    Spouse name: Not on file   Number of children: 1   Years of education: 12+   Highest education level: Not on file  Occupational History    Employer: [employer]  Tobacco Use   Smoking status: Never   Smokeless tobacco: Never  Vaping Use   Vaping Use: Never used  Substance and Sexual Activity   Alcohol use: No   Drug use: No   Sexual activity: Not Currently    Partners: Male    Birth control/protection: Post-menopausal  Other Topics Concern   Not on file  Social History Narrative   Patient works as a Education officer, 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 The patient is divorced and lives with her daughter.    Social Determinants of Health   Financial Resource Strain: Not on file  Food Insecurity: Not on file  Transportation Needs: Not on file  Physical Activity: Not on file  Stress: Not on file  Social Connections: Not on file  Intimate Partner Violence: Not on file   PHYSICAL EXAM  Vitals:   01/20/21 0806  BP: 125/72  Pulse: 72  Weight: 147 lb (66.7 kg)  Height: 5\' 5"  (1.651 m)    Body mass index is 24.46 kg/m.  Generalized: Well developed, in no acute distress  Neurological examination  Mentation: Alert oriented to time, place, history taking. Follows all commands speech and language fluent Cranial nerve II-XII: Pupils were equal round reactive to light. Extraocular movements were full, visual field were full on confrontational test. Facial sensation and strength were normal. Head turning and shoulder shrug  were normal and symmetric. Motor: The motor testing reveals 5 over 5 strength of all 4 extremities. Good symmetric motor tone is noted throughout.  Sensory: Sensory testing is intact to soft touch on all  4 extremities. No evidence of extinction is noted.  Coordination: Cerebellar testing reveals good finger-nose-finger and heel-to-shin bilaterally.  Gait and station: Gait is normal.   Reflexes: Deep tendon reflexes are symmetric and normal bilaterally.   DIAGNOSTIC DATA (LABS, IMAGING, TESTING) - I reviewed patient records, labs, notes, testing and imaging myself where available.  Lab Results  Component Value Date   WBC 7.2 06/20/2018   HGB 13.7 06/20/2018   HCT 44.1 06/20/2018   MCV 96.5 06/20/2018   PLT 194 06/20/2018   No results found for: NA, K, CL, CO2, GLUCOSE, BUN, CREATININE, CALCIUM, PROT, ALBUMIN, AST, ALT, ALKPHOS, BILITOT, GFRNONAA, GFRAA No results found for: CHOL, HDL, LDLCALC, LDLDIRECT, TRIG, CHOLHDL No results found for: HGBA1C No results found for: VITAMINB12 Lab Results  Component Value Date   TSH 2.110 01/10/2018      ASSESSMENT AND PLAN 55 y.o. year old female  has a past medical history of Migraines,  Sinus problem, and STD (sexually transmitted disease). here with:  1.  Chronic migraine headaches  -Will try to add on Ajovy (reviewed savings card online), has new health insurance, CGRP would be a good option for the patient, as she has tried multiple previous other medications, we will not increase Topamax due to side effect -For now, continue Topamax at current dosing which is 50 mg twice daily -Will send prescription for generic Relpax for her to try, right now she is paying for brand-name Relpax, which is 6 tablets for $300 -Encouraged to reach out via MyChart message, if she has issues with insurance coverage, or if generic Relpax does not work for her, may consider Roselyn Meier or Nurtec as another option -Follow-up in 6 months or sooner if needed  Evangeline Dakin, DNP 01/20/2021, 8:33 AM [facility] [address] [place], [place] [address] [phone]

## 2021-01-20 ENCOUNTER — Encounter: Payer: Self-pay | Admitting: Neurology

## 2021-01-20 ENCOUNTER — Other Ambulatory Visit: Payer: Self-pay

## 2021-01-20 ENCOUNTER — Ambulatory Visit (INDEPENDENT_AMBULATORY_CARE_PROVIDER_SITE_OTHER): Payer: PRIVATE HEALTH INSURANCE | Admitting: Neurology

## 2021-01-20 DIAGNOSIS — G43009 Migraine without aura, not intractable, without status migrainosus: Secondary | ICD-10-CM

## 2021-01-20 MED ORDER — ELETRIPTAN HYDROBROMIDE 40 MG PO TABS: 40.0000 mg | ORAL_TABLET | ORAL | 3 refills | Status: DC | PRN

## 2021-01-20 MED ORDER — TOPIRAMATE 25 MG PO TABS
ORAL_TABLET | ORAL | 5 refills | Status: DC
Start: 2021-01-20 — End: 2021-07-28

## 2021-01-20 MED ORDER — AJOVY 225 MG/1.5ML ~~LOC~~ SOAJ: 225.0000 mg | SUBCUTANEOUS | 11 refills | Status: DC

## 2021-01-20 NOTE — Patient Instructions (Signed)
Try the Ajovy for migraine prevention  Continue the Topamax for now Try generic Relpax  See you back in 6 months

## 2021-01-20 NOTE — Progress Notes (Signed)
I have read the note, and I agree with the clinical assessment and plan.  Shadrick Senne K Maruice Pieroni   

## 2021-01-24 ENCOUNTER — Telehealth: Payer: Self-pay | Admitting: Emergency Medicine

## 2021-01-24 NOTE — Telephone Encounter (Signed)
Rachel Anthony [phone] for PA submission of Ajovy Case # [id number]  Awaiting determination from [facility] Will by notified by fax.

## 2021-02-01 NOTE — Telephone Encounter (Addendum)
Received notification from [facility] that the patient did not meet criteria for Ajovy to be approved.  Per letter, the patient must have a documentation of inadequate response or contraindication to two different migraine prevention therapies from different classes. 1) Antiepileptics (divalproex, valproate, topiramate) 2) Antidepressants (amitriptyline, venlafaxine) 3) Beta-blockers (metoprolol, prpranolo, timolol) 4) Botox  In review of chart, she has tried divalproex, topiramate, amitriptyline.  She must also have history of 4 or more migraine days per month prior to starting preventative medications.  She meets requirements. Per covermymeds, Avjoy was approved on 01/24/21. However, we received a letter dated 01/31/21 that it was denied.  I spoke to Raquel Sarna at [facility] today who told me Arie Sabina is still showing a PA is needed.  I will call [employer] at [phone] to try to figure out the problem.

## 2021-02-01 NOTE — Telephone Encounter (Addendum)
Per Tomi Bamberger at [facility], there is an issue with the case on file. She recommended starting a new case on covermymeds. New PA started on covermymeds (key: [id number]). Pt [id number]. [id number] pending. Chart notes faxed to [phone] for pharmacist review. Decision pending.  I called the patient to let her know we are working on this issue.

## 2021-02-02 ENCOUNTER — Ambulatory Visit
Admission: RE | Admit: 2021-02-02 | Discharge: 2021-02-02 | Disposition: A | Discharge status: Home / Self Care | Payer: Managed Care, Other (non HMO) | Source: Ambulatory Visit | Attending: Orthopedic Surgery | Admitting: Orthopedic Surgery

## 2021-02-02 ENCOUNTER — Other Ambulatory Visit: Payer: Self-pay

## 2021-02-02 ENCOUNTER — Other Ambulatory Visit: Payer: Self-pay | Admitting: Family Medicine

## 2021-02-02 DIAGNOSIS — Z1231 Encounter for screening mammogram for malignant neoplasm of breast: Secondary | ICD-10-CM

## 2021-02-08 NOTE — Telephone Encounter (Addendum)
Case [id number] approved through 02/07/2022. I also left a message at [facility] to notify them of this approval.

## 2021-03-24 ENCOUNTER — Encounter (HOSPITAL_BASED_OUTPATIENT_CLINIC_OR_DEPARTMENT_OTHER): Payer: Self-pay | Admitting: Obstetrics & Gynecology

## 2021-03-24 ENCOUNTER — Other Ambulatory Visit: Payer: Self-pay

## 2021-03-24 ENCOUNTER — Ambulatory Visit (INDEPENDENT_AMBULATORY_CARE_PROVIDER_SITE_OTHER): Payer: Managed Care, Other (non HMO) | Admitting: Obstetrics & Gynecology

## 2021-03-24 VITALS — BP 137/81 | HR 67 | Ht 64.5 in | Wt 152.4 lb

## 2021-03-24 DIAGNOSIS — G43009 Migraine without aura, not intractable, without status migrainosus: Secondary | ICD-10-CM | POA: Diagnosis not present

## 2021-03-24 DIAGNOSIS — Z01419 Encounter for gynecological examination (general) (routine) without abnormal findings: Secondary | ICD-10-CM | POA: Diagnosis not present

## 2021-03-24 DIAGNOSIS — Z90721 Acquired absence of ovaries, unilateral: Secondary | ICD-10-CM

## 2021-03-24 DIAGNOSIS — Z1159 Encounter for screening for other viral diseases: Secondary | ICD-10-CM | POA: Diagnosis not present

## 2021-03-24 DIAGNOSIS — Z0000 Encounter for general adult medical examination without abnormal findings: Secondary | ICD-10-CM | POA: Diagnosis not present

## 2021-03-24 DIAGNOSIS — Z9079 Acquired absence of other genital organ(s): Secondary | ICD-10-CM

## 2021-03-24 NOTE — Progress Notes (Signed)
55 y.o. G12P1001 Divorced White or Caucasian female here for annual exam.  Doing well.  Denies vaginal bleeding.    Patient's last menstrual period was 08/01/2018 (approximate).          Sexually active: No.  The current method of family planning is post menopausal status.    Exercising: Yes.     walking Smoker:  no  Health Maintenance: Pap:  01/17/2019 Negative History of abnormal Pap:  no MMG:  02/02/2021 Negative Colonoscopy:  08/04/2019, follow up 10 years BMD:   not indicated TDaP:  08/16/2021 Shingrix:   completed    reports that she has never smoked. She has never used smokeless tobacco. She reports that she does not drink alcohol and does not use drugs.  Past Medical History:  Diagnosis Date   Migraines    no aura   Sinus problem    STD (sexually transmitted disease)    HSV 2    Past Surgical History:  Procedure Laterality Date   CESAREAN SECTION  2000   CYSTOSCOPY  06/24/2018   Procedure: CYSTOSCOPY;  Surgeon: Megan Salon, MD;  Location: [facility];  Service: Gynecology;;   LAPAROSCOPIC OVARIAN CYSTECTOMY Left 06/24/2018   Procedure: LAPAROSCOPIC OOPHERECTOMY LYSIS OF ADHESIONS;  Surgeon: Megan Salon, MD;  Location: [facility];  Service: Gynecology;  Laterality: Left;   LAPAROSCOPIC UNILATERAL SALPINGECTOMY Right 06/24/2018   Procedure: LAPAROSCOPIC BILATERAL SALPINGECTOMY;  Surgeon: Megan Salon, MD;  Location: [facility];  Service: Gynecology;  Laterality: Right;   POLYPECTOMY  2010    Current Outpatient Medications  Medication Sig Dispense Refill   eletriptan (RELPAX) 40 MG tablet Take 1 tablet (40 mg total) by mouth as needed for migraine. May repeat in 2 hours if headache persists or recurs. 6 tablet 3   loratadine (CLARITIN) 10 MG tablet Take 10 mg by mouth as needed for allergies.     topiramate (TOPAMAX) 25 MG tablet TAKE (2) TABLETS TWICE DAILY. 120 tablet 5   Fremanezumab-vfrm (AJOVY) 225 MG/1.5ML  SOAJ Inject 225 mg into the skin every 30 (thirty) days. (Patient not taking: Reported on 03/24/2021) 1.68 mL 11   No current facility-administered medications for this visit.    Family History  Problem Relation Age of Onset   Hypertension Mother    Colon cancer Paternal Grandmother    Heart disease Maternal Grandmother     Review of Systems  All other systems reviewed and are negative.  Exam:   BP 137/81 (BP Location: Right Arm, Patient Position: Sitting, Cuff Size: Small)   Pulse 67   Ht 5' 4.5" (1.638 m)   Wt 152 lb 6.4 oz (69.1 kg)   LMP 08/01/2018 (Approximate)   BMI 25.76 kg/m   Height: 5' 4.5" (163.8 cm)  General appearance: alert, cooperative and appears stated age Head: Normocephalic, without obvious abnormality, atraumatic Neck: no adenopathy, supple, symmetrical, trachea midline and thyroid normal to inspection and palpation Lungs: clear to auscultation bilaterally Breasts: normal appearance, no masses or tenderness Heart: regular rate and rhythm Abdomen: soft, non-tender; bowel sounds normal; no masses,  no organomegaly Extremities: extremities normal, atraumatic, no cyanosis or edema Skin: Skin color, texture, turgor normal. No rashes or lesions Lymph nodes: Cervical, supraclavicular, and axillary nodes normal. No abnormal inguinal nodes palpated Neurologic: Grossly normal   Pelvic: External genitalia:  no lesions              Urethra:  normal appearing urethra with no masses, tenderness or  lesions              Bartholins and Skenes: normal                 Vagina: normal appearing vagina with normal color and no discharge, no lesions              Cervix: no lesions              Pap taken: No. Bimanual Exam:  Uterus:  normal size, contour, position, consistency, mobility, non-tender              Adnexa: normal adnexa and no mass, fullness, tenderness               Rectovaginal: Confirms               Anus:  normal sphincter tone, no lesions  Chaperone,  Octaviano Batty, CMA, was present for exam.  Assessment/Plan: 1. Well woman exam with routine gynecological exam - pap with neg HR HPV 2020.  Repeat next year. - MMG 01/2021 - colonoscoyp 08/2019, follow up 10 years - plan BMD closer to age 69 - Care gaps reviewed/updated  2. Encounter for hepatitis C screening test for low risk patient - Hepatitis C antibody  3. Blood tests for routine general physical examination - CBC - Comprehensive metabolic panel - TSH - Lipid panel  4. Migraine without aura and without status migrainosus, not intractable  5. History of right salpingectomy/LSO 06/2018

## 2021-03-25 LAB — LIPID PANEL
Chol/HDL Ratio: 2.4 ratio (ref 0.0–4.4)
Cholesterol, Total: 221 mg/dL — ABNORMAL HIGH (ref 100–199)
HDL: 91 mg/dL (ref >39)
LDL Chol Calc (NIH): 121 mg/dL — ABNORMAL HIGH (ref 0–99)
Triglycerides: 50 mg/dL (ref 0–149)
VLDL Cholesterol Cal: 9 mg/dL (ref 5–40)

## 2021-03-25 LAB — CBC
Hematocrit: 41.8 % (ref 34.0–46.6)
Hemoglobin: 13.7 g/dL (ref 11.1–15.9)
MCH: 29.8 pg (ref 26.6–33.0)
MCHC: 32.8 g/dL (ref 31.5–35.7)
MCV: 91 fL (ref 79–97)
Platelets: 187 10*3/uL (ref 150–450)
RBC: 4.59 x10E6/uL (ref 3.77–5.28)
RDW: 11.9 % (ref 11.7–15.4)
WBC: 5.8 10*3/uL (ref 3.4–10.8)

## 2021-03-25 LAB — COMPREHENSIVE METABOLIC PANEL
ALT: 14 IU/L (ref 0–32)
AST: 21 IU/L (ref 0–40)
Albumin/Globulin Ratio: 2.1 (ref 1.2–2.2)
Albumin: 4.8 g/dL (ref 3.8–4.9)
Alkaline Phosphatase: 97 IU/L (ref 44–121)
BUN/Creatinine Ratio: 20 (ref 9–23)
BUN: 16 mg/dL (ref 6–24)
Bilirubin Total: 0.8 mg/dL (ref 0.0–1.2)
CO2: 20 mmol/L (ref 20–29)
Calcium: 9.4 mg/dL (ref 8.7–10.2)
Chloride: 107 mmol/L — ABNORMAL HIGH (ref 96–106)
Creatinine, Ser: 0.79 mg/dL (ref 0.57–1.00)
Globulin, Total: 2.3 g/dL (ref 1.5–4.5)
Glucose: 89 mg/dL (ref 65–99)
Potassium: 4.3 mmol/L (ref 3.5–5.2)
Sodium: 144 mmol/L (ref 134–144)
Total Protein: 7.1 g/dL (ref 6.0–8.5)
eGFR: 89 mL/min/{1.73_m2} (ref >59)

## 2021-03-25 LAB — HEPATITIS C ANTIBODY: Hep C Virus Ab: <0.1 s/co ratio (ref 0.0–0.9)

## 2021-03-25 LAB — TSH: TSH: 2.74 u[IU]/mL (ref 0.450–4.500)

## 2021-07-09 ENCOUNTER — Other Ambulatory Visit: Payer: Self-pay | Admitting: Neurology

## 2021-07-09 DIAGNOSIS — G43009 Migraine without aura, not intractable, without status migrainosus: Secondary | ICD-10-CM

## 2021-07-12 NOTE — Telephone Encounter (Signed)
Rx refilled.

## 2021-07-27 NOTE — Progress Notes (Signed)
PATIENT: Rachel Anthony DOB: 12/30/1965  REASON FOR VISIT: follow up for migraine headache HISTORY FROM: patient Primary Neurologist: Dr. Billey Gosling   HISTORY OF PRESENT ILLNESS: Today 07/28/21  Rachel Anthony is here today for follow-up with history of migraine headache. Last visit sent in [place], never started it, due to insurance issues. Claims generic Relpax didn't work as well a brand name, generic takes 5-6 hours to get relief. Having to take all 6 tablets of Relpax every month. About 6 migraines a month, having some mild headaches, related to allergies. Taking Topamax 50 mg twice daily. In past has tried Imitrex and Maxalt.   Update 01/20/21 SS: Rachel Anthony here today to follow-up on migraines. 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 Remains on Topamax 50 mg twice daily. Insurance wouldn't cover [place]. Is on brand name Relpax, $350, insurance only allow 6 tablets, wants to try generic. Rarely has to take 2nd. On average having 2-3 times a week having migraines, working a lot of overtime.    Update 01/19/2020 SS: Rachel Anthony 56 year old female with history of migraines.  She is on Topamax 50 mg twice a day, brand-name Relpax as needed.  Has had an increase in headaches due to work related stress.  She is a Designer, fashion/clothing.  On average, 1-2 headaches a week, 1-2 migraines a month, headaches come in clusters, could have headache for several days that relpax won't touch.  She has previously tried Depakote, amitriptyline, is currently on Topamax.  Does have mild side effect of Topamax with brain fog.  Relpax is effective usually, but is expensive over $100 a month for 6 tablets, uses card, prices goes up subsequent months.  Some months, she takes her entire supply.  For mild headaches, takes Excedrin Migraine she is careful with diet to avoid food triggers, no soft drinks.  Overall health is good.   Presents today for evaluation unaccompanied.  HISTORY 12/12/2018 SS: Rachel Anthony is a 56 year old female with history of migraines since the 3rd grade.  She is currently taking Topamax 50 mg twice daily, BRAND Relpax as needed.  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  She reports she has been having a few migraines weekly.  She will take Excedrin Migraine or Relpax.  The Relpax works well, this is expensive, she only gets 6 tablets/month. She describes her migraine as occurring the left side, has photophobia, nausea.  Bright lights, smells, nitrates, exercise is a trigger for the headaches.  In the past she has tried Depakote, but had side effect of tremor.    REVIEW OF SYSTEMS: Out of a complete 14 system review of symptoms, the patient complains only of the following symptoms, and all other reviewed systems are negative.  Headache  ALLERGIES: No Known Allergies  HOME MEDICATIONS: Outpatient Medications Prior to Visit  Medication Sig Dispense Refill   eletriptan (RELPAX) 40 MG tablet Take 1 tablet (40 mg total) by mouth as needed for migraine. May repeat in 2 hours if headache persists or recurs. 6 tablet 0   loratadine (CLARITIN) 10 MG tablet Take 10 mg by mouth as needed for allergies.     topiramate (TOPAMAX) 25 MG tablet TAKE (2) TABLETS TWICE DAILY. 120 tablet 5   Fremanezumab-vfrm (AJOVY) 225  MG/1.5ML SOAJ Inject 225 mg into the skin every 30 (thirty) days. (Patient not taking: Reported on 03/24/2021) 1.68 mL 11   No facility-administered medications prior to visit.    PAST MEDICAL HISTORY: Past Medical History:  Diagnosis Date   Migraines    no aura   Sinus problem    STD (sexually transmitted disease)    HSV 2    PAST SURGICAL HISTORY: Past Surgical History:  Procedure Laterality Date   CESAREAN SECTION  2000   CYSTOSCOPY  06/24/2018    Procedure: CYSTOSCOPY;  Surgeon: Megan Salon, MD;  Location: [facility];  Service: Gynecology;;   LAPAROSCOPIC OVARIAN CYSTECTOMY Left 06/24/2018   Procedure: LAPAROSCOPIC OOPHERECTOMY LYSIS OF ADHESIONS;  Surgeon: Megan Salon, MD;  Location[facility];  Service: Gynecology;  Laterality: Left;   LAPAROSCOPIC UNILATERAL SALPINGECTOMY Right 06/24/2018   Procedure: LAPAROSCOPIC BILATERAL SALPINGECTOMY;  Surgeon: Megan Salon, MD;  Location: [facility];  Service: Gynecology;  Laterality: Right;   POLYPECTOMY  2010    FAMILY HISTORY: Family History  Problem Relation Age of Onset   Hypertension Mother    Colon cancer Paternal Grandmother    Heart disease Maternal Grandmother     SOCIAL HISTORY: Social History   Socioeconomic History   Marital status: Divorced    Spouse name: Not on file   Number of children: 1   Years of education: 12+   Highest education level: Not on file  Occupational History    Employer: [employer]  Tobacco Use   Smoking status: Never   Smokeless tobacco: Never  Vaping Use   Vaping Use: Never used  Substance and Sexual Activity   Alcohol use: No   Drug use: No   Sexual activity: Not Currently    Partners: Male    Birth control/protection: Post-menopausal  Other Topics Concern   Not on file  Social History Narrative   Patient works as a Education officer, 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 The patient is divorced and lives with her daughter.    Social Determinants of Health   Financial Resource Strain: Not on file  Food Insecurity: Not on file  Transportation Needs: Not on file  Physical Activity: Not on file  Stress: Not on file  Social Connections: Not on file  Intimate Partner Violence: Not on file   PHYSICAL EXAM  Vitals:   07/28/21 0813  BP: 132/82  Pulse: 72  Weight: 165 lb (74.8 kg)  Height: 5' 4.5" (1.638 m)    Body mass index is 27.88 kg/m.  Generalized: Well developed, in no acute distress  Neurological examination  Mentation: Alert oriented to time, place, history taking. Follows all commands speech and language fluent Cranial nerve II-XII: Pupils were equal round reactive to light. Extraocular movements were full, visual field were full on confrontational test. Facial sensation and strength were normal. Head turning and shoulder shrug  were normal and symmetric. Motor: The motor testing reveals 5 over 5 strength of all 4 extremities. Good symmetric motor tone is noted throughout.  Sensory: Sensory testing is intact to soft touch on all 4 extremities. No evidence of extinction is noted.  Coordination: Cerebellar testing reveals good finger-nose-finger and heel-to-shin bilaterally.  Gait and station: Gait is normal.   Reflexes: Deep tendon reflexes are symmetric and normal bilaterally.   DIAGNOSTIC DATA (LABS, IMAGING, TESTING) - I reviewed patient records, labs, notes, testing and imaging  myself where available.  Lab Results  Component Value Date   WBC 5.8 03/24/2021   HGB 13.7 03/24/2021   HCT 41.8 03/24/2021   MCV 91 03/24/2021   PLT 187 03/24/2021      Component Value Date/Time   NA 144 03/24/2021 0903   K 4.3 03/24/2021 0903   CL 107 (H) 03/24/2021 0903   CO2 20 03/24/2021 0903   GLUCOSE 89 03/24/2021 0903   BUN 16 03/24/2021 0903   CREATININE 0.79 03/24/2021 0903   CALCIUM 9.4 03/24/2021 0903   PROT 7.1 03/24/2021 0903   ALBUMIN 4.8 03/24/2021 0903   AST 21 03/24/2021 0903   ALT 14 03/24/2021 0903   ALKPHOS 97 03/24/2021 0903   BILITOT 0.8 03/24/2021 0903   Lab Results  Component Value Date   CHOL 221 (H) 03/24/2021   HDL 91 03/24/2021   LDLCALC 121 (H) 03/24/2021   TRIG 50 03/24/2021   CHOLHDL 2.4 03/24/2021   No results found for: HGBA1C No results found for: VITAMINB12 Lab Results  Component Value Date   TSH 2.740 03/24/2021   ASSESSMENT AND PLAN 57  y.o. year old female  has a past medical history of Migraines, Sinus problem, and STD (sexually transmitted disease). here with:  1.  Chronic migraine headaches  -Plan to add CGRP in April with new insurance, we tried Ajovy, but claims was still expensive even with co-pay card -For now, we will continue Topamax 50 mg twice daily as migraine preventative -Prefers brand-name Relpax, but is quite expensive, for now we will continue generic, may appeal for brand in future -Will give Nurtec sample (3 boxes) to try for acute headache, if helpful, will send when new insurance starts -She will reach out in the next few months with insurance update, will add in CGRP for migraine preventative, otherwise I will see her back in 6 months or sooner if needed -She will now be followed by Dr. Billey Gosling since Dr. Jannifer Franklin retired   Butler Denmark, AGNP-C, [place] 07/28/2021, 8:18 AM [facility] [address] [place], [place] [address] [phone]

## 2021-07-28 ENCOUNTER — Ambulatory Visit (INDEPENDENT_AMBULATORY_CARE_PROVIDER_SITE_OTHER): Payer: PRIVATE HEALTH INSURANCE | Admitting: Neurology

## 2021-07-28 ENCOUNTER — Encounter: Payer: Self-pay | Admitting: Neurology

## 2021-07-28 DIAGNOSIS — G43009 Migraine without aura, not intractable, without status migrainosus: Secondary | ICD-10-CM

## 2021-07-28 MED ORDER — TOPIRAMATE 25 MG PO TABS: ORAL_TABLET | ORAL | 5 refills | Status: DC

## 2021-07-28 NOTE — Patient Instructions (Signed)
Reach out to me in the next few months about your insurance will make decision about injectable for prevention  For now, keep things the same See you back in 6 months

## 2021-08-22 IMAGING — MG MM DIGITAL SCREENING BILAT W/ TOMO AND CAD
6 of 10 series · 6 of 30 positions shown · non-contrast
Comparison: Previous exam(s).

CLINICAL DATA: Screening.

EXAM:
DIGITAL SCREENING BILATERAL MAMMOGRAM WITH TOMOSYNTHESIS AND CAD
TECHNIQUE: Bilateral screening digital craniocaudal and mediolateral oblique
mammograms were obtained. Bilateral screening digital breast
tomosynthesis was performed. The images were evaluated with
computer-aided detection.

[L CC synth-2D]
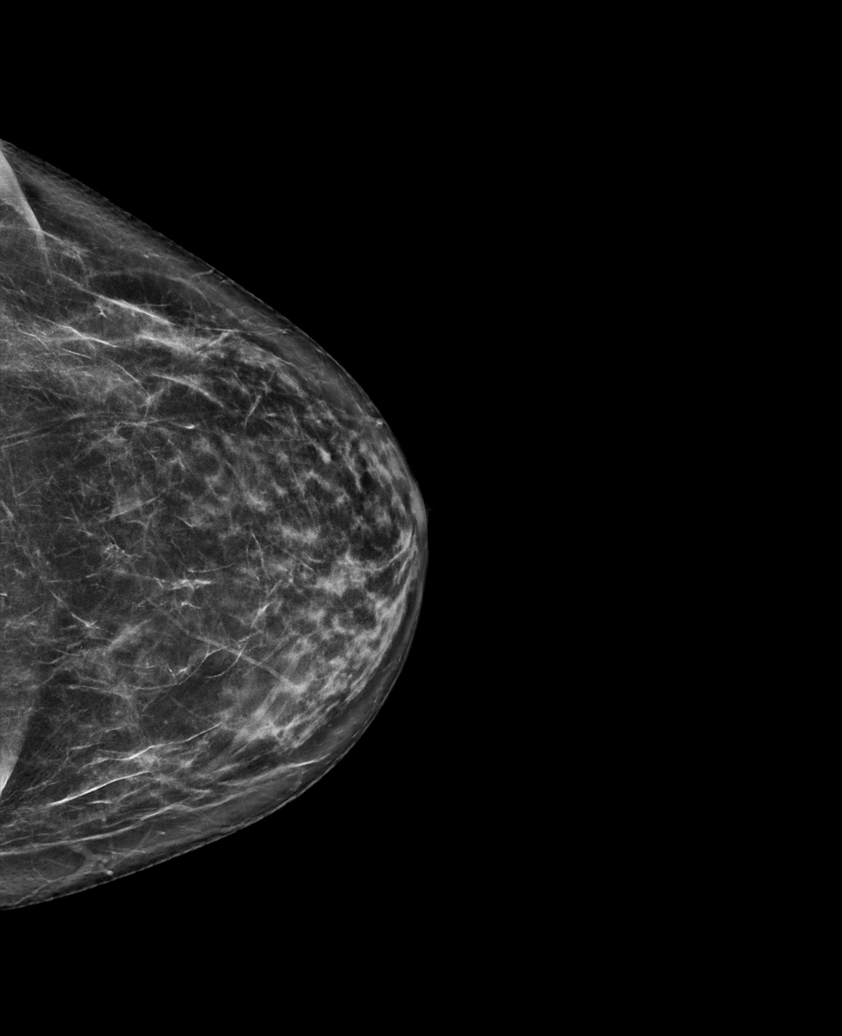

[L MLO synth-2D]
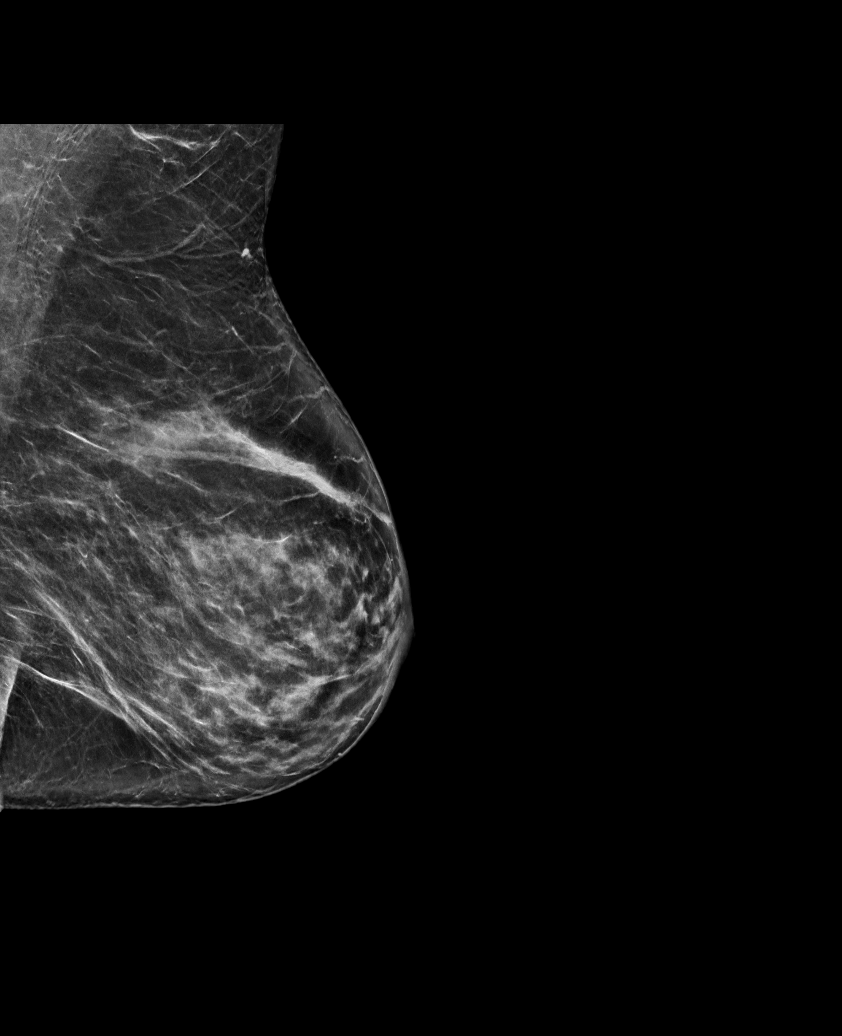

[R CC synth-2D]
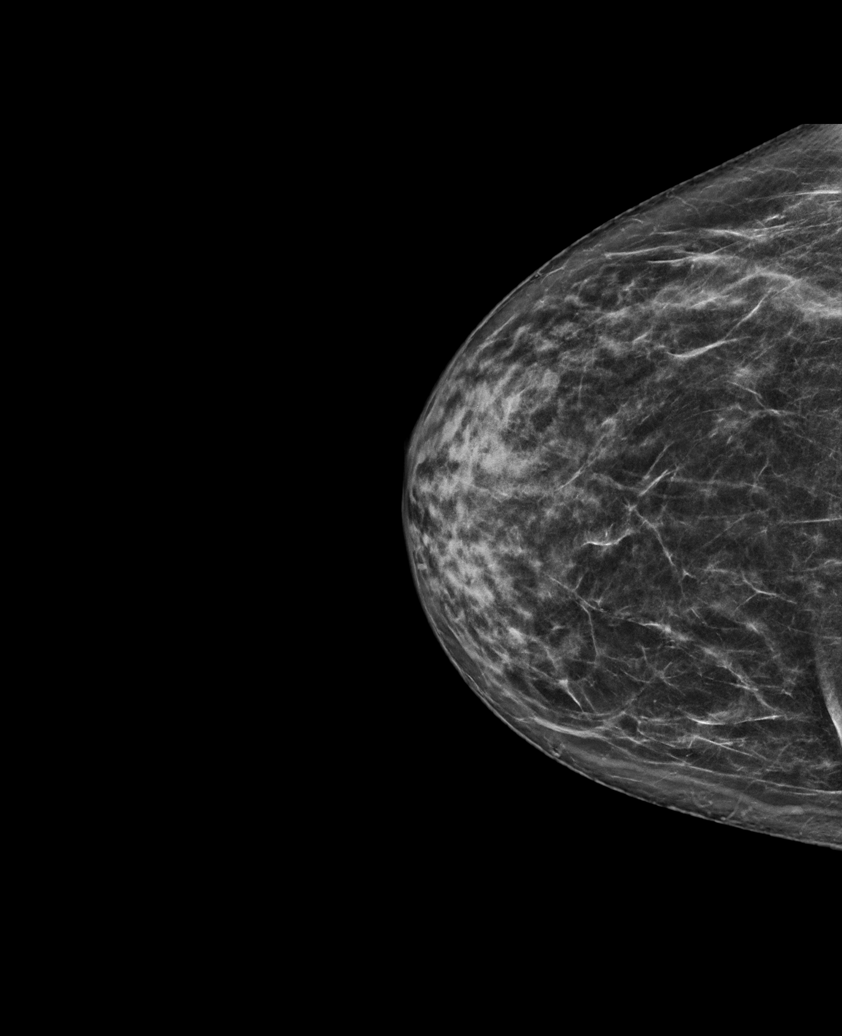

[R MLO synth-2D (1 of 2)]
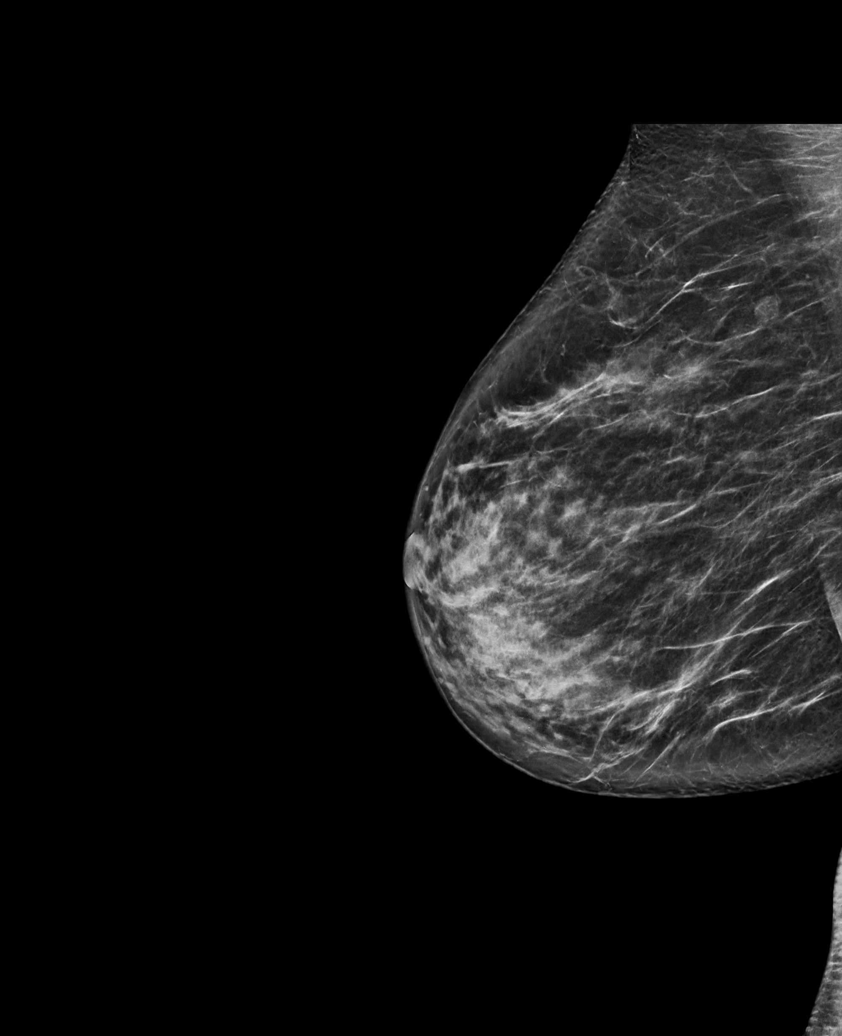

[R MLO synth-2D (2 of 2)]
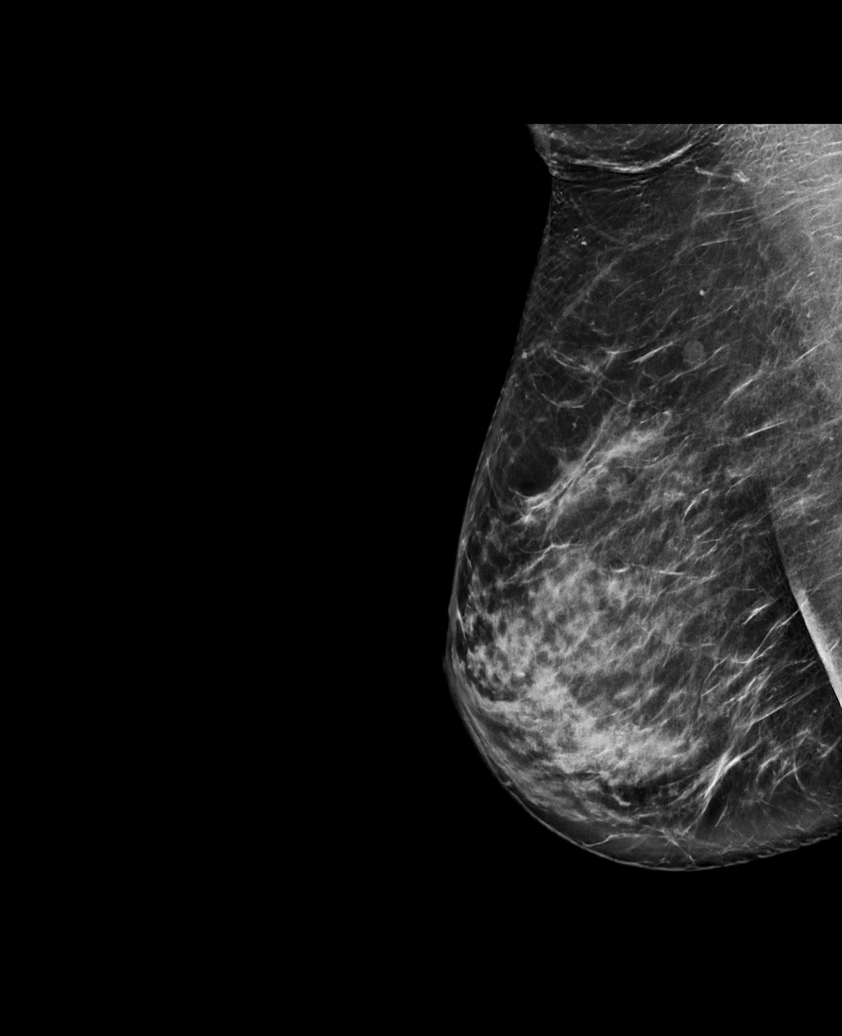

[R MLO tomo · tomo slice 39/76.0]
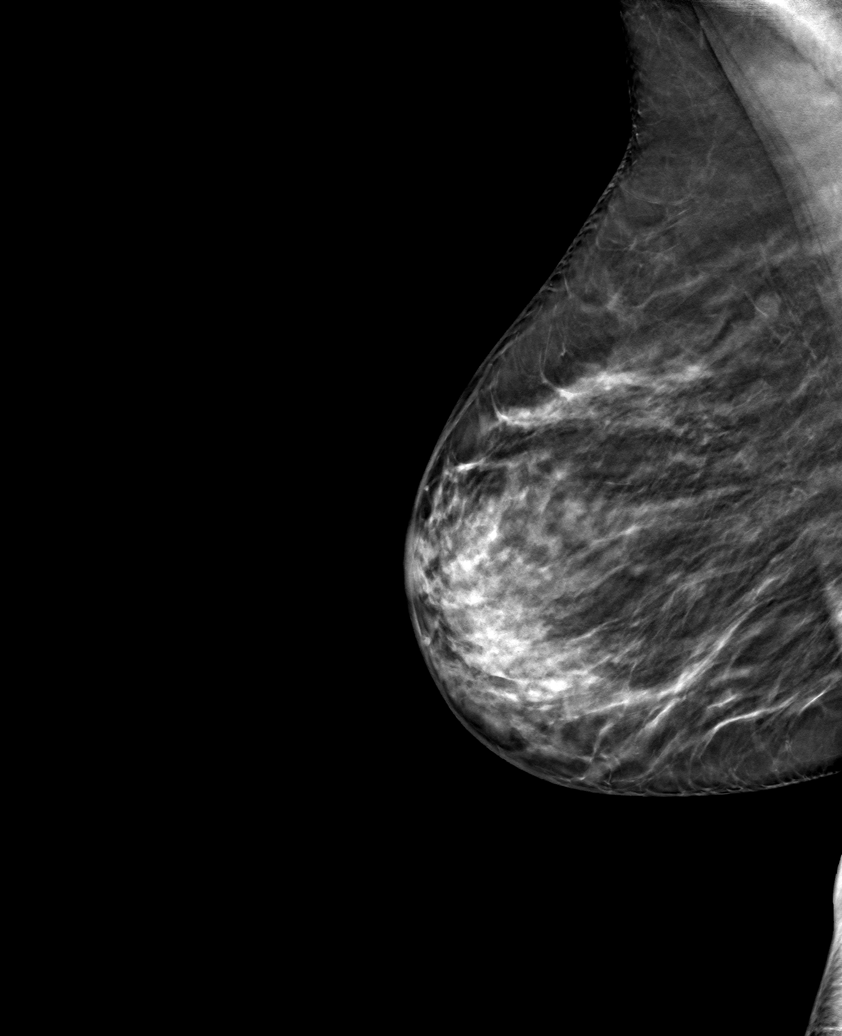

[6 of 30 positions shown; findings below may reference images not displayed]

ACR Breast Density Category c: The breast tissue is heterogeneously
dense, which may obscure small masses.
FINDINGS: There are no findings suspicious for malignancy.
IMPRESSION: No mammographic evidence of malignancy. A result letter of this
screening mammogram will be mailed directly to the patient.

RECOMMENDATION:
Screening mammogram in one year. (Code:[id number]-BC3)

BI-RADS CATEGORY  1: Negative.

## 2021-08-30 ENCOUNTER — Other Ambulatory Visit: Payer: Self-pay | Admitting: Family Medicine

## 2021-08-30 DIAGNOSIS — Z1231 Encounter for screening mammogram for malignant neoplasm of breast: Secondary | ICD-10-CM

## 2021-09-28 ENCOUNTER — Other Ambulatory Visit: Payer: Self-pay | Admitting: Neurology

## 2021-09-28 DIAGNOSIS — G43009 Migraine without aura, not intractable, without status migrainosus: Secondary | ICD-10-CM

## 2021-10-25 ENCOUNTER — Other Ambulatory Visit: Payer: Self-pay | Admitting: Neurology

## 2021-10-25 DIAGNOSIS — G43009 Migraine without aura, not intractable, without status migrainosus: Secondary | ICD-10-CM

## 2021-12-23 ENCOUNTER — Encounter: Payer: Self-pay | Admitting: Neurology

## 2021-12-26 ENCOUNTER — Telehealth: Payer: Self-pay | Admitting: *Deleted

## 2021-12-26 NOTE — Telephone Encounter (Addendum)
PA for Nurtec 75mg  started on covermymeds (key: [id number]). Pharmacy coverage through [place] [phone]). If approved, the allowable amount by her plan would be 8 tabs per month. Decision pending.   Mychart message from patient:  Rachel Anthony,  I hope you are well! I have tried the Nurtec samples you provided me. It works much better than the generic Eletriptan Hyrdobromide Tab. My BCBS does not cover Nurtec. Ugh! I spoke with medical assistant program with [facility] who manufactures this as you suggested. They may be able to assist me with the Nurtec. They just an order from you. Do you mind sending in a prescription to them? You can do 1 of 3 ways.  -Fax  or e-scribe to #[phone]  -Call-#[phone]  They will also ask about my insurance. They will basically call [employer] and get a denial and then be able to process my request. My insurance has changed.  New insurance BCBS  Subscriber# [id number]  effective date 10/01/21   If you are able to do this -that would be great! If you have any questions -please reach out to me or call me.  Rachel Anthony  [phone]

## 2021-12-26 NOTE — Telephone Encounter (Signed)
PA has been initiated. I spoke to the patient and provided her with this update. Note in Epic.

## 2021-12-27 ENCOUNTER — Other Ambulatory Visit: Payer: Self-pay | Admitting: *Deleted

## 2021-12-27 MED ORDER — NURTEC 75 MG PO TBDP: ORAL_TABLET | ORAL | 11 refills | Status: DC

## 2021-12-27 NOTE — Telephone Encounter (Addendum)
[employer] denied Nurtec. Bernita Raisin preferred. The patient would like to stay on Nurtec and will obtain the medication through the Nurtec [facility] Program through [facility] pharmacy. Per vo by Margie Ege, NP, okay for patient to continue Nurtec. Rx e-scribed. Letter of denial from [facility] also faxed to the program.   Per Jani Files. at [facility], the patient should call them to schedule delivery. She should wait 24 hours to allow time for the prescription to be activated in their system.   I called the patient and provided this update.

## 2022-01-25 NOTE — Progress Notes (Unsigned)
PATIENT: Rachel Anthony DOB: 03/19/1966  REASON FOR VISIT: follow up for migraine headache HISTORY FROM: patient Primary Neurologist: Dr. Billey Gosling   HISTORY OF PRESENT ILLNESS: Today 01/26/22  Rachel Anthony is here today for follow-up. Has found generic Relpax to not be helpful. For Nurtec, it takes 2 hours to get any relief. Brand name Relpax for sure works the best, but is too expensive. Called Nurtec got drug company assistance, gets # 8 a month. Having 3 migraines a month. Her insurance will not pay for any CGRP injection, is too expensive, can't use co-pay card. Taking Topamax 50 mg twice daily. Feels foggy, worries about long term side effect.  Had tremor with Depakote in the past.  Update 07/28/21 SS: Rachel Anthony is here today for follow-up with history of migraine headache. Last visit sent in [place], never started it, due to insurance issues. Claims generic Relpax didn't work as well a brand name, generic takes 5-6 hours to get relief. Having to take all 6 tablets of Relpax every month. About 6 migraines a month, having some mild headaches, related to allergies. Taking Topamax 50 mg twice daily. In past has tried Imitrex and Maxalt.   Update 01/20/21 SS: Rachel Anthony here today to follow-up on migraines. 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 Remains on Topamax 50 mg twice daily. Insurance wouldn't cover [place]. Is on brand name Relpax, $350, insurance only allow 6 tablets, wants to try generic. Rarely has to take 2nd. On average having 2-3 times a week having migraines, working a lot of overtime.    Update 01/19/2020 SS: Rachel Anthony 56 year old female with history of migraines.  She is on Topamax 50 mg twice a day, brand-name Relpax as needed.  Has had an increase in headaches due to work related stress.  She is a Designer, fashion/clothing.  On average, 1-2 headaches a week, 1-2 migraines a month, headaches  come in clusters, could have headache for several days that relpax won't touch.  She has previously tried Depakote, amitriptyline, is currently on Topamax.  Does have mild side effect of Topamax with brain fog.  Relpax is effective usually, but is expensive over $100 a month for 6 tablets, uses card, prices goes up subsequent months.  Some months, she takes her entire supply.  For mild headaches, takes Excedrin Migraine she is careful with diet to avoid food triggers, no soft drinks.  Overall health is good.  Presents today for evaluation unaccompanied.  HISTORY 12/12/2018 SS: Rachel Anthony is a 56 year old female with history of migraines since the 3rd grade.  She is currently taking Topamax 50 mg twice daily, BRAND Relpax as needed.  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  She reports she has been having a few migraines weekly.  She will take Excedrin Migraine or Relpax.  The Relpax works well, this is expensive, she only gets 6 tablets/month. She describes her migraine as occurring the left side, has photophobia, nausea.  Bright lights, smells, nitrates, exercise is a trigger for the headaches.  In the past she has tried Depakote, but had side effect of tremor.    REVIEW OF SYSTEMS: Out of a complete 14 system review of symptoms, the patient complains only of the following symptoms, and all other  reviewed systems are negative.  Headache  ALLERGIES: No Known Allergies  HOME MEDICATIONS: Outpatient Medications Prior to Visit  Medication Sig Dispense Refill   loratadine (CLARITIN) 10 MG tablet Take 10 mg by mouth as needed for allergies.     Rimegepant Sulfate (NURTEC) 75 MG TBDP Take one tablet daily as needed for acute migraine. #8 allowed per 30 days. 8 tablet 11   topiramate (TOPAMAX) 25 MG tablet TAKE (2) TABLETS TWICE DAILY. 120 tablet 5   eletriptan (RELPAX) 40  MG tablet Take 1 tablet (40 mg total) by mouth as needed for migraine. May repeat in 2 hours if headache persists or recurs. (Patient not taking: Reported on 01/26/2022) 6 tablet 5   No facility-administered medications prior to visit.    PAST MEDICAL HISTORY: Past Medical History:  Diagnosis Date   Migraines    no aura   Sinus problem    STD (sexually transmitted disease)    HSV 2    PAST SURGICAL HISTORY: Past Surgical History:  Procedure Laterality Date   CESAREAN SECTION  2000   CYSTOSCOPY  06/24/2018   Procedure: CYSTOSCOPY;  Surgeon: Megan Salon, MD;  Location: [facility];  Service: Gynecology;;   LAPAROSCOPIC OVARIAN CYSTECTOMY Left 06/24/2018   Procedure: LAPAROSCOPIC OOPHERECTOMY LYSIS OF ADHESIONS;  Surgeon: Megan Salon, MD;  Location: [facility];  Service: Gynecology;  Laterality: Left;   LAPAROSCOPIC UNILATERAL SALPINGECTOMY Right 06/24/2018   Procedure: LAPAROSCOPIC BILATERAL SALPINGECTOMY;  Surgeon: Megan Salon, MD;  Location: [facility];  Service: Gynecology;  Laterality: Right;   POLYPECTOMY  2010    FAMILY HISTORY: Family History  Problem Relation Age of Onset   Hypertension Mother    Colon cancer Paternal Grandmother    Heart disease Maternal Grandmother     SOCIAL HISTORY: Social History   Socioeconomic History   Marital status: Divorced    Spouse name: Not on file   Number of children: 1   Years of education: 12+   Highest education level: Master's degree (e.g., MA, MS, MEng, MEd, MSW, MBA)  Occupational History    Employer: [employer]  Tobacco Use   Smoking status: Never   Smokeless tobacco: Never  Vaping Use   Vaping Use: Never used  Substance and Sexual Activity   Alcohol use: No   Drug use: No   Sexual activity: Not Currently    Partners: Male    Birth control/protection: Post-menopausal  Other Topics Concern   Not on file  Social History Narrative    Patient works as a Education officer, 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 The patient is divorced and lives with her daughter.    Social Determinants of Health   Financial Resource Strain: Not on file  Food Insecurity: Not on file  Transportation Needs: Not on file  Physical Activity: Not on file  Stress: Not on file  Social Connections: Not on file  Intimate Partner Violence: Not on file   PHYSICAL EXAM  Vitals:   01/26/22 0834  BP: 130/84  Pulse: 70  Weight: 166 lb 9.6 oz (75.6 kg)  Height: 5' 4.5" (1.638 m)    Body mass index is 28.16 kg/m.  Generalized: Well developed, in no acute distress  Neurological examination  Mentation: Alert oriented to time, place, history taking. Follows all commands speech and language fluent Cranial nerve II-XII: Pupils were equal round reactive to light. Extraocular movements were  full, visual field were full on confrontational test. Facial sensation and strength were normal. Head turning and shoulder shrug  were normal and symmetric. Motor: The motor testing reveals 5 over 5 strength of all 4 extremities. Good symmetric motor tone is noted throughout.  Sensory: Sensory testing is intact to soft touch on all 4 extremities. No evidence of extinction is noted.  Coordination: Cerebellar testing reveals good finger-nose-finger and heel-to-shin bilaterally.  Gait and station: Gait is normal.   Reflexes: Deep tendon reflexes are symmetric and normal bilaterally.   DIAGNOSTIC DATA (LABS, IMAGING, TESTING) - I reviewed patient records, labs, notes, testing and imaging myself where available.  Lab Results  Component Value Date   WBC 5.8 03/24/2021   HGB 13.7 03/24/2021   HCT 41.8 03/24/2021   MCV 91 03/24/2021   PLT 187 03/24/2021      Component Value Date/Time   NA 144 03/24/2021 0903   K 4.3 03/24/2021 0903   CL 107 (H) 03/24/2021 0903   CO2 20 03/24/2021 0903   GLUCOSE 89 03/24/2021 0903   BUN 16  03/24/2021 0903   CREATININE 0.79 03/24/2021 0903   CALCIUM 9.4 03/24/2021 0903   PROT 7.1 03/24/2021 0903   ALBUMIN 4.8 03/24/2021 0903   AST 21 03/24/2021 0903   ALT 14 03/24/2021 0903   ALKPHOS 97 03/24/2021 0903   BILITOT 0.8 03/24/2021 0903   Lab Results  Component Value Date   CHOL 221 (H) 03/24/2021   HDL 91 03/24/2021   LDLCALC 121 (H) 03/24/2021   TRIG 50 03/24/2021   CHOLHDL 2.4 03/24/2021   No results found for: "HGBA1C" No results found for: "VITAMINB12" Lab Results  Component Value Date   TSH 2.740 03/24/2021   ASSESSMENT AND PLAN 56 y.o. year old female  has a past medical history of Migraines, Sinus problem, and STD (sexually transmitted disease). here with:  1.  Chronic migraine headaches  -Stop Topamax, switch to Zonegran 50 mg at bedtime for migraine preventative -Her insurance company will not pay for CGRP injectable, is having to get Nurtec from drug assistance company, cannot use a co-pay card -Try Fioricet as needed for severe headache, cautioned about rebound headache, not to use more than twice a week -In the past has tried and failed Depakote, if needed we could consider nortriptyline, Effexor as preventative; hopefully we can find a good rescue drug, potentially may not have to be on preventative -Encouraged to reach out for any medication issues, I will see her back in 6 months  Meds ordered this encounter  Medications   zonisamide (ZONEGRAN) 50 MG capsule    Sig: Take 1 capsule (50 mg total) by mouth at bedtime.    Dispense:  30 capsule    Refill:  11   butalbital-acetaminophen-caffeine (FIORICET) 50-325-40 MG tablet    Sig: Take 1 tablet by mouth every 6 (six) hours as needed for headache.    Dispense:  10 tablet    Refill:  3    Butler Denmark, Kechi, DNP 01/26/2022, 9:17 AM [facility] [address] [place], [place] [address] [phone]

## 2022-01-26 ENCOUNTER — Ambulatory Visit (INDEPENDENT_AMBULATORY_CARE_PROVIDER_SITE_OTHER): Payer: BC Managed Care – PPO | Admitting: Neurology

## 2022-01-26 ENCOUNTER — Encounter: Payer: Self-pay | Admitting: Neurology

## 2022-01-26 VITALS — BP 130/84 | HR 70 | Ht 64.5 in | Wt 166.6 lb

## 2022-01-26 DIAGNOSIS — G43009 Migraine without aura, not intractable, without status migrainosus: Secondary | ICD-10-CM | POA: Diagnosis not present

## 2022-01-26 MED ORDER — BUTALBITAL-APAP-CAFFEINE 50-325-40 MG PO TABS: 1.0000 | ORAL_TABLET | Freq: Four times a day (QID) | ORAL | 3 refills | Status: DC | PRN

## 2022-01-26 MED ORDER — ZONISAMIDE 50 MG PO CAPS: 50.0000 mg | ORAL_CAPSULE | Freq: Every day | ORAL | 11 refills | Status: DC

## 2022-01-26 NOTE — Patient Instructions (Signed)
Stop the Topamax, start Zonegran 50 mg at bedtime  for headache prevention Try Fioricet at onset of headache for acute treatment  Call me if you have any medications issues

## 2022-02-02 ENCOUNTER — Ambulatory Visit
Admission: RE | Admit: 2022-02-02 | Discharge: 2022-02-02 | Disposition: A | Discharge status: Home / Self Care | Payer: BC Managed Care – PPO | Source: Ambulatory Visit | Attending: Family Medicine | Admitting: Family Medicine

## 2022-02-02 DIAGNOSIS — Z1231 Encounter for screening mammogram for malignant neoplasm of breast: Secondary | ICD-10-CM

## 2022-03-27 ENCOUNTER — Ambulatory Visit (INDEPENDENT_AMBULATORY_CARE_PROVIDER_SITE_OTHER): Payer: BC Managed Care – PPO | Admitting: Obstetrics & Gynecology

## 2022-03-27 ENCOUNTER — Encounter (HOSPITAL_BASED_OUTPATIENT_CLINIC_OR_DEPARTMENT_OTHER): Payer: Self-pay | Admitting: Obstetrics & Gynecology

## 2022-03-27 ENCOUNTER — Other Ambulatory Visit (HOSPITAL_COMMUNITY)
Admission: RE | Admit: 2022-03-27 | Discharge: 2022-03-27 | Disposition: A | Discharge status: Home / Self Care | Payer: BC Managed Care – PPO | Source: Ambulatory Visit | Attending: Obstetrics & Gynecology | Admitting: Obstetrics & Gynecology

## 2022-03-27 VITALS — BP 134/75 | HR 67 | Ht 64.25 in | Wt 171.8 lb

## 2022-03-27 DIAGNOSIS — Z90721 Acquired absence of ovaries, unilateral: Secondary | ICD-10-CM

## 2022-03-27 DIAGNOSIS — Z23 Encounter for immunization: Secondary | ICD-10-CM | POA: Diagnosis not present

## 2022-03-27 DIAGNOSIS — Z124 Encounter for screening for malignant neoplasm of cervix: Secondary | ICD-10-CM

## 2022-03-27 DIAGNOSIS — G43009 Migraine without aura, not intractable, without status migrainosus: Secondary | ICD-10-CM

## 2022-03-27 DIAGNOSIS — Z01419 Encounter for gynecological examination (general) (routine) without abnormal findings: Secondary | ICD-10-CM | POA: Diagnosis not present

## 2022-03-27 DIAGNOSIS — Z9079 Acquired absence of other genital organ(s): Secondary | ICD-10-CM | POA: Diagnosis not present

## 2022-03-27 NOTE — Progress Notes (Signed)
56 y.o. G54P1001 Divorced White or Caucasian female here for annual exam.  Continuing to have issues with headaches.  Followed by neurology.  Used to see Dr. Jannifer Franklin.  He retired so now seeing Butler Denmark, NP.  Relpax works best for pt but her insurance will not cover this.    Denies vaginal bleeding.    Patient's last menstrual period was 08/01/2018 (approximate).          Sexually active: Yes.    The current method of family planning is post menopausal status.    Exercising: Yes.     walking Smoker:  no  Health Maintenance: Pap:  01/17/2019 Negative History of abnormal Pap:  no MMG:  03/01/2022 Negative Colonoscopy:  08/04/2019, follow up 10 years BMD:   not indicated Screening Labs: does with Dr. Orland Mustard   reports that she has never smoked. She has never used smokeless tobacco. She reports that she does not drink alcohol and does not use drugs.  Past Medical History:  Diagnosis Date   Migraines    no aura   Sinus problem    STD (sexually transmitted disease)    HSV 2    Past Surgical History:  Procedure Laterality Date   CESAREAN SECTION  2000   CYSTOSCOPY  06/24/2018   Procedure: CYSTOSCOPY;  Surgeon: Megan Salon, MD;  Location: [facility];  Service: Gynecology;;   LAPAROSCOPIC OVARIAN CYSTECTOMY Left 06/24/2018   Procedure: LAPAROSCOPIC OOPHERECTOMY LYSIS OF ADHESIONS;  Surgeon: Megan Salon, MD;  Location: [facility];  Service: Gynecology;  Laterality: Left;   LAPAROSCOPIC UNILATERAL SALPINGECTOMY Right 06/24/2018   Procedure: LAPAROSCOPIC BILATERAL SALPINGECTOMY;  Surgeon: Megan Salon, MD;  Location[facility];  Service: Gynecology;  Laterality: Right;   POLYPECTOMY  2010    Current Outpatient Medications  Medication Sig Dispense Refill   loratadine (CLARITIN) 10 MG tablet Take 10 mg by mouth as needed for allergies.     Rimegepant Sulfate (NURTEC) 75 MG TBDP Take one tablet daily as needed for acute migraine. #8  allowed per 30 days. 8 tablet 11   zonisamide (ZONEGRAN) 50 MG capsule Take 1 capsule (50 mg total) by mouth at bedtime. 30 capsule 11   No current facility-administered medications for this visit.    Family History  Problem Relation Age of Onset   Hypertension Mother    Colon cancer Paternal Grandmother    Heart disease Maternal Grandmother     ROS: Constitutional: negative Genitourinary:negative  Exam:   BP 134/75 (BP Location: Right Arm, Patient Position: Sitting, Cuff Size: Normal)   Pulse 67   Ht 5' 4.25" (1.632 m)   Wt 171 lb 12.8 oz (77.9 kg)   LMP 08/01/2018 (Approximate)   BMI 29.26 kg/m   Height: 5' 4.25" (163.2 cm)  General appearance: alert, cooperative and appears stated age Head: Normocephalic, without obvious abnormality, atraumatic Neck: no adenopathy, supple, symmetrical, trachea midline and thyroid normal to inspection and palpation Lungs: clear to auscultation bilaterally Breasts: normal appearance, no masses or tenderness Heart: regular rate and rhythm Abdomen: soft, non-tender; bowel sounds normal; no masses,  no organomegaly Extremities: extremities normal, atraumatic, no cyanosis or edema Skin: Skin color, texture, turgor normal. No rashes or lesions Lymph nodes: Cervical, supraclavicular, and axillary nodes normal. No abnormal inguinal nodes palpated Neurologic: Grossly normal   Pelvic: External genitalia:  no lesions              Urethra:  normal appearing urethra with no  masses, tenderness or lesions              Bartholins and Skenes: normal                 Vagina: normal appearing vagina with normal color and no discharge, no lesions              Cervix: no lesions              Pap taken: Yes.   Bimanual Exam:  Uterus:  normal size, contour, position, consistency, mobility, non-tender              Adnexa: normal adnexa and no mass, fullness, tenderness               Rectovaginal: Confirms               Anus:  normal sphincter tone, no  lesions  Chaperone, Octaviano Batty, CMA, was present for exam.  Assessment/Plan: 1. Well woman exam with routine gynecological exam - Pap smear obtained today - Mammogram 01/2022 - Colonoscopy 2021, follow up 10 years - Bone mineral density not indicated - lab work done done with PCP - vaccines reviewed/updated.  Tdap updated.    2. Migraine without aura and without status migrainosus, not intractable  3. Cervical cancer screening - Cytology - PAP( [place])  4.  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

## 2022-03-30 LAB — CYTOLOGY - PAP
Comment: NEGATIVE
Diagnosis: NEGATIVE
High risk HPV: NEGATIVE

## 2022-04-14 DIAGNOSIS — Z23 Encounter for immunization: Secondary | ICD-10-CM | POA: Diagnosis not present

## 2022-07-19 DIAGNOSIS — Z0000 Encounter for general adult medical examination without abnormal findings: Secondary | ICD-10-CM | POA: Diagnosis not present

## 2022-07-19 DIAGNOSIS — E785 Hyperlipidemia, unspecified: Secondary | ICD-10-CM | POA: Diagnosis not present

## 2022-08-10 ENCOUNTER — Ambulatory Visit (INDEPENDENT_AMBULATORY_CARE_PROVIDER_SITE_OTHER): Payer: BC Managed Care – PPO | Admitting: Neurology

## 2022-08-10 ENCOUNTER — Encounter: Payer: Self-pay | Admitting: Neurology

## 2022-08-10 VITALS — BP 138/83 | HR 80 | Ht 65.5 in | Wt 175.0 lb

## 2022-08-10 DIAGNOSIS — G43009 Migraine without aura, not intractable, without status migrainosus: Secondary | ICD-10-CM | POA: Diagnosis not present

## 2022-08-10 MED ORDER — ZONISAMIDE 50 MG PO CAPS: 50.0000 mg | ORAL_CAPSULE | Freq: Every day | ORAL | 3 refills | Status: DC

## 2022-08-10 MED ORDER — NURTEC 75 MG PO TBDP: ORAL_TABLET | ORAL | 11 refills | Status: DC

## 2022-08-10 NOTE — Patient Instructions (Signed)
Please let me know if your headaches increase, for now we will continue the Zonegran and Nurtec, try Ubrelvy and Zavzpret to see if better benefit, also add in Aleve or Ibuprofen at onset of headache for faster relief   Hang in there! I wish you a speedy recovery with your leg injury.  Let me know if you need anything :)

## 2022-08-10 NOTE — Progress Notes (Signed)
PATIENT: Rachel Anthony DOB: 06/30/1966  REASON FOR VISIT: follow up for migraine headache HISTORY FROM: patient Primary Neurologist: Dr. Billey Gosling   HISTORY OF PRESENT ILLNESS: Today 08/10/22  Recovering from a dog attack few weeks ago, has surgical consult later today. Still working as Education officer, museum at [employer].  Migraines continue.We switched from Topamax to Zonegran, no problems, not a huge difference. Gets 8 tablets of Nurtec monthly, would be rare to use all tablets monthly.  Nurtec works but does take 2 hours. Tried Fioricet, it took 6 hours, didn't work well. Insurance is difficult to to work with, can't use copay cards for newer medications.   Update 01/26/22 SS: Rachel Anthony is here today for follow-up. Has found generic Relpax to not be helpful. For Nurtec, it takes 2 hours to get any relief. Brand name Relpax for sure works the best, but is too expensive. Called Nurtec got drug company assistance, gets # 8 a month. Having 3 migraines a month. Her insurance will not pay for any CGRP injection, is too expensive, can't use co-pay card. Taking Topamax 50 mg twice daily. Feels foggy, worries about long term side effect.  Had tremor with Depakote in the past.  Update 07/28/21 SS: Rachel Anthony is here today for follow-up with history of migraine headache. Last visit sent in [place], never started it, due to insurance issues. Claims generic Relpax didn't work as well a brand name, generic takes 5-6 hours to get relief. Having to take all 6 tablets of Relpax every month. About 6 migraines a month, having some mild headaches, related to allergies. Taking Topamax 50 mg twice daily. In past has tried Imitrex and Maxalt.   Update 01/20/21 SS: Rachel Anthony here today to follow-up on migraines. 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 Remains on Topamax 50 mg twice daily. Insurance wouldn't cover [place]. Is on brand name  Relpax, $350, insurance only allow 6 tablets, wants to try generic. Rarely has to take 2nd. On average having 2-3 times a week having migraines, working a lot of overtime.    Update 01/19/2020 SS: Rachel Anthony 57 year old female with history of migraines.  She is on Topamax 50 mg twice a day, brand-name Relpax as needed.  Has had an increase in headaches due to work related stress.  She is a Designer, fashion/clothing.  On average, 1-2 headaches a week, 1-2 migraines a month, headaches come in clusters, could have headache for several days that relpax won't touch.  She has previously tried Depakote, amitriptyline, is currently on Topamax.  Does have mild side effect of Topamax with brain fog.  Relpax is effective usually, but is expensive over $100 a month for 6 tablets, uses card, prices goes up subsequent months.  Some months, she takes her entire supply.  For mild headaches, takes Excedrin Migraine she is careful with diet to avoid food triggers, no soft drinks.  Overall health is good.  Presents today for evaluation unaccompanied.  HISTORY 12/12/2018 SS: Rachel Anthony is a 57 year old female with history of migraines since the 3rd grade.  She is currently taking Topamax 50 mg twice daily, BRAND Relpax as needed.  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  She reports she has been having a few migraines weekly.  She  will take Excedrin Migraine or Relpax.  The Relpax works well, this is expensive, she only gets 6 tablets/month. She describes her migraine as occurring the left side, has photophobia, nausea.  Bright lights, smells, nitrates, exercise is a trigger for the headaches.  In the past she has tried Depakote, but had side effect of tremor.    REVIEW OF SYSTEMS: Out of a complete 14 system review of symptoms, the patient complains only of the following symptoms, and all other  reviewed systems are negative.  Headache  ALLERGIES: No Known Allergies  HOME MEDICATIONS: Outpatient Medications Prior to Visit  Medication Sig Dispense Refill   loratadine (CLARITIN) 10 MG tablet Take 10 mg by mouth as needed for allergies.     Rimegepant Sulfate (NURTEC) 75 MG TBDP Take one tablet daily as needed for acute migraine. #8 allowed per 30 days. 8 tablet 11   zonisamide (ZONEGRAN) 50 MG capsule Take 1 capsule (50 mg total) by mouth at bedtime. 30 capsule 11   No facility-administered medications prior to visit.    PAST MEDICAL HISTORY: Past Medical History:  Diagnosis Date   Migraines    no aura   Sinus problem    STD (sexually transmitted disease)    HSV 2    PAST SURGICAL HISTORY: Past Surgical History:  Procedure Laterality Date   CESAREAN SECTION  2000   CYSTOSCOPY  06/24/2018   Procedure: CYSTOSCOPY;  Surgeon: Megan Salon, MD;  [facility];  Service: Gynecology;;   LAPAROSCOPIC OVARIAN CYSTECTOMY Left 06/24/2018   Procedure: LAPAROSCOPIC OOPHERECTOMY LYSIS OF ADHESIONS;  Surgeon: Megan Salon, MD;  [facility];  Service: Gynecology;  Laterality: Left;   LAPAROSCOPIC UNILATERAL SALPINGECTOMY Right 06/24/2018   Procedure: LAPAROSCOPIC BILATERAL SALPINGECTOMY;  Surgeon: Megan Salon, MD;  Location: [facility];  Service: Gynecology;  Laterality: Right;   POLYPECTOMY  2010    FAMILY HISTORY: Family History  Problem Relation Age of Onset   Hypertension Mother    Colon cancer Paternal Grandmother    Heart disease Maternal Grandmother     SOCIAL HISTORY: Social History   Socioeconomic History   Marital status: Divorced    Spouse name: Not on file   Number of children: 1   Years of education: 12+   Highest education level: Master's degree (e.g., MA, MS, MEng, MEd, MSW, MBA)  Occupational History    Employer: [employer]  Tobacco Use   Smoking status:  Never   Smokeless tobacco: Never  Vaping Use   Vaping Use: Never used  Substance and Sexual Activity   Alcohol use: No   Drug use: No   Sexual activity: Not Currently    Partners: Male    Birth control/protection: Post-menopausal  Other Topics Concern   Not on file  Social History Narrative   Patient works as a Education officer, museum at [employer] for 13 years and has her masters degree. The patient is divorced and lives with her daughter.    Social Determinants of Health   Financial Resource Strain: Not on file  Food Insecurity: Not on file  Transportation Needs: Not on file  Physical Activity: Not on file  Stress: Not on file  Social Connections: Not on file  Intimate Partner Violence: Not on file   PHYSICAL EXAM  Vitals:   08/10/22 0746  BP: 138/83  Pulse: 80  Weight: 175 lb (79.4 kg)  Height: 5' 5.5" (1.664 m)  Body mass index is 28.68 kg/m.  Generalized: Well developed, in no acute distress  Neurological examination  Mentation: Alert oriented to time, place, history taking. Follows all commands speech and language fluent Cranial nerve II-XII: Pupils were equal round reactive to light. Extraocular movements were full, visual field were full on confrontational test. Facial sensation and strength were normal. Head turning and shoulder shrug  were normal and symmetric. Motor: The motor testing reveals 5 over 5 strength of all 4 extremities. Good symmetric motor tone is noted throughout.  Sensory: Sensory testing is intact to soft touch on all 4 extremities. No evidence of extinction is noted.  Coordination: Cerebellar testing reveals good finger-nose-finger and heel-to-shin bilaterally.  Gait and station: Gait is normal.    DIAGNOSTIC DATA (LABS, IMAGING, TESTING) - I reviewed patient records, labs, notes, testing and imaging myself where available.  Lab Results  Component Value Date   WBC 5.8 03/24/2021   HGB 13.7 03/24/2021   HCT 41.8  03/24/2021   MCV 91 03/24/2021   PLT 187 03/24/2021      Component Value Date/Time   NA 144 03/24/2021 0903   K 4.3 03/24/2021 0903   CL 107 (H) 03/24/2021 0903   CO2 20 03/24/2021 0903   GLUCOSE 89 03/24/2021 0903   BUN 16 03/24/2021 0903   CREATININE 0.79 03/24/2021 0903   CALCIUM 9.4 03/24/2021 0903   PROT 7.1 03/24/2021 0903   ALBUMIN 4.8 03/24/2021 0903   AST 21 03/24/2021 0903   ALT 14 03/24/2021 0903   ALKPHOS 97 03/24/2021 0903   BILITOT 0.8 03/24/2021 0903   Lab Results  Component Value Date   CHOL 221 (H) 03/24/2021   HDL 91 03/24/2021   LDLCALC 121 (H) 03/24/2021   TRIG 50 03/24/2021   CHOLHDL 2.4 03/24/2021   No results found for: "HGBA1C" No results found for: "VITAMINB12" Lab Results  Component Value Date   TSH 2.740 03/24/2021   ASSESSMENT AND PLAN 57 y.o. year old female  has a past medical history of Migraines, Sinus problem, and STD (sexually transmitted disease). here with:  1.  Chronic migraine headaches  -Right now she is pleased with current migraine control, we talked about options including: Increasing Zonegran, trial of nortriptyline or Effexor, her insurance will not pay for CGRP injectable medications, could consider Botox, about 10 headache days a month, 15 at max -Continue Zonegran 50 mg at bedtime for migraine preventative -Continue Nurtec 75 mg for acute headache treatment, gets her drug assistance company -Previously tried and failed: Depakote, Topamax, amitriptyline, Fioricet, Relpax, Imitrex, Maxalt -I gave her a sample of Ubrelvy to try as well as Zavzpret since Nurtec works well but takes a long time, also encouraged her to add NSAIDs Aleve or ibuprofen at onset of headache -Recheck via MyChart for medication adjustment, otherwise follow-up in 1 year, also headaches often occur in morning, monitor for any signs of sleep apnea (snoring, dry mouth, daytime fatigue, poor quality rest)   Meds ordered this encounter  Medications    zonisamide (ZONEGRAN) 50 MG capsule    Sig: Take 1 capsule (50 mg total) by mouth at bedtime.    Dispense:  90 capsule    Refill:  3   Rimegepant Sulfate (NURTEC) 75 MG TBDP    Sig: Take one tablet daily as needed for acute migraine. #8 allowed per 30 days.    Dispense:  8 tablet    Refill:  11    Sent to [employer] for patient assistance [employer]).  Butler Denmark, AGNP-C, DNP 08/10/2022, 8:43 AM [facility] [address] [place], [place] [address] [phone]

## 2022-10-25 ENCOUNTER — Other Ambulatory Visit: Payer: Self-pay

## 2022-10-25 ENCOUNTER — Telehealth: Payer: Self-pay | Admitting: Neurology

## 2022-10-25 MED ORDER — NURTEC 75 MG PO TBDP: ORAL_TABLET | ORAL | 11 refills | Status: DC

## 2022-10-25 NOTE — Telephone Encounter (Signed)
Pt states the [facility] is no longer providing the free samples of Nurtec.  Pt is asking that a Rx be sent to [facility]

## 2022-10-25 NOTE — Telephone Encounter (Signed)
Called pt LVM that Nurtec refills has been sent to pharmacy.

## 2022-10-25 NOTE — Telephone Encounter (Signed)
Please call and inform patient that refill has been sent

## 2023-01-19 ENCOUNTER — Other Ambulatory Visit: Payer: Self-pay | Admitting: Family Medicine

## 2023-01-19 DIAGNOSIS — Z1231 Encounter for screening mammogram for malignant neoplasm of breast: Secondary | ICD-10-CM

## 2023-02-07 ENCOUNTER — Ambulatory Visit
Admission: RE | Admit: 2023-02-07 | Discharge: 2023-02-07 | Disposition: A | Discharge status: Home / Self Care | Payer: BC Managed Care – PPO | Source: Ambulatory Visit | Attending: Family Medicine | Admitting: Family Medicine

## 2023-02-07 DIAGNOSIS — Z1231 Encounter for screening mammogram for malignant neoplasm of breast: Secondary | ICD-10-CM | POA: Diagnosis not present

## 2023-03-28 DIAGNOSIS — Z23 Encounter for immunization: Secondary | ICD-10-CM | POA: Diagnosis not present

## 2023-04-02 ENCOUNTER — Ambulatory Visit (HOSPITAL_BASED_OUTPATIENT_CLINIC_OR_DEPARTMENT_OTHER): Payer: BC Managed Care – PPO | Admitting: Obstetrics & Gynecology

## 2023-05-16 ENCOUNTER — Encounter (HOSPITAL_BASED_OUTPATIENT_CLINIC_OR_DEPARTMENT_OTHER): Payer: Self-pay | Admitting: Obstetrics & Gynecology

## 2023-05-16 ENCOUNTER — Ambulatory Visit (HOSPITAL_BASED_OUTPATIENT_CLINIC_OR_DEPARTMENT_OTHER): Payer: BC Managed Care – PPO | Admitting: Obstetrics & Gynecology

## 2023-05-16 VITALS — BP 152/80 | HR 57 | Ht 64.5 in | Wt 188.6 lb

## 2023-05-16 DIAGNOSIS — Z780 Asymptomatic menopausal state: Secondary | ICD-10-CM | POA: Diagnosis not present

## 2023-05-16 DIAGNOSIS — Z90721 Acquired absence of ovaries, unilateral: Secondary | ICD-10-CM

## 2023-05-16 DIAGNOSIS — G43009 Migraine without aura, not intractable, without status migrainosus: Secondary | ICD-10-CM

## 2023-05-16 DIAGNOSIS — Z01419 Encounter for gynecological examination (general) (routine) without abnormal findings: Secondary | ICD-10-CM | POA: Diagnosis not present

## 2023-05-16 DIAGNOSIS — Z9079 Acquired absence of other genital organ(s): Secondary | ICD-10-CM

## 2023-05-16 DIAGNOSIS — W540XXS Bitten by dog, sequela: Secondary | ICD-10-CM

## 2023-05-16 DIAGNOSIS — Z87898 Personal history of other specified conditions: Secondary | ICD-10-CM

## 2023-05-16 NOTE — Progress Notes (Signed)
57 y.o. G30P1001 Divorced White or Caucasian female here for annual exam.  Was victim of dog attack last December.  Ended up having I&D in the OR.  Wound vac ultimately was placed and this really did help with the healing.  This has been a very long ordeal.  We discussed therapy and she does want to do this.  Worker's compensation has made this difficult.  Is having some right hip pain due to favoring right hip.  Seeing Dr. Charlann Boxer and has had three cortisone injections.  Diagnosis was bursitis.  Has done imaging.  Will start PT and has been approved for 12 visits.  She is also currently on oral prednisone.     Patient's last menstrual period was 08/01/2018 (approximate).          The current method of family planning is post menopausal status.    Smoker:  no  Health Maintenance: Pap:  03/27/2022 Negative History of abnormal Pap:  no MMG:  02/06/2022 Negative Colonoscopy:  08/04/2019 BMD:   not indicated Screening Labs: does with Dr. Kateri Plummer   reports that she has never smoked. She has never used smokeless tobacco. She reports that she does not drink alcohol and does not use drugs.  Past Medical History:  Diagnosis Date   Dog attack    85- 17 puncture wounds   Migraines    no aura   Sinus problem    STD (sexually transmitted disease)    HSV 2    Past Surgical History:  Procedure Laterality Date   CESAREAN SECTION  2000   CYSTOSCOPY  06/24/2018   Procedure: CYSTOSCOPY;  Surgeon: Jerene Bears, MD;  Location: [facility];  Service: Gynecology;;   LAPAROSCOPIC OVARIAN CYSTECTOMY Left 06/24/2018   Procedure: LAPAROSCOPIC OOPHERECTOMY LYSIS OF ADHESIONS;  Surgeon: Jerene Bears, MD;  Location: [facility];  Service: Gynecology;  Laterality: Left;   LAPAROSCOPIC UNILATERAL SALPINGECTOMY Right 06/24/2018   Procedure: LAPAROSCOPIC BILATERAL SALPINGECTOMY;  Surgeon: Jerene Bears, MD;  Location: [facility];  Service: Gynecology;  Laterality:  Right;   POLYPECTOMY  2010    Current Outpatient Medications  Medication Sig Dispense Refill   loratadine (CLARITIN) 10 MG tablet Take 10 mg by mouth as needed for allergies.     predniSONE (DELTASONE) 5 MG tablet Take 5 mg by mouth daily with breakfast. 12 days     Rimegepant Sulfate (NURTEC) 75 MG TBDP Take one tablet daily as needed for acute migraine. #8 allowed per 30 days. 8 tablet 11   zonisamide (ZONEGRAN) 50 MG capsule Take 1 capsule (50 mg total) by mouth at bedtime. 90 capsule 3   No current facility-administered medications for this visit.    Family History  Problem Relation Age of Onset   Hypertension Mother    Colon cancer Paternal Grandmother    Heart disease Maternal Grandmother     ROS: Constitutional: negative Genitourinary:negative  Exam:   BP (!) 152/80 (BP Location: Right Arm, Patient Position: Sitting, Cuff Size: Large)   Pulse (!) 57   Ht 5' 4.5" (1.638 m)   Wt 188 lb 9.6 oz (85.5 kg)   LMP 08/01/2018 (Approximate)   BMI 31.87 kg/m   Height: 5' 4.5" (163.8 cm)  General appearance: alert, cooperative and appears stated age Head: Normocephalic, without obvious abnormality, atraumatic Neck: no adenopathy, supple, symmetrical, trachea midline and thyroid normal to inspection and palpation Lungs: clear to auscultation bilaterally Breasts: normal appearance, no masses or tenderness Heart: regular  rate and rhythm Abdomen: soft, non-tender; bowel sounds normal; no masses,  no organomegaly Extremities: extremities normal, atraumatic, no cyanosis or edema Skin: Skin color, texture, turgor normal. No rashes or lesions Lymph nodes: Cervical, supraclavicular, and axillary nodes normal. No abnormal inguinal nodes palpated Neurologic: Grossly normal   Pelvic: External genitalia:  no lesions              Urethra:  normal appearing urethra with no masses, tenderness or lesions              Bartholins and Skenes: normal                 Vagina: normal appearing  vagina with normal color and no discharge, no lesions              Cervix: no lesions              Pap taken: No. Bimanual Exam:  Uterus:  normal size, contour, position, consistency, mobility, non-tender              Adnexa: no mass, fullness, tenderness               Rectovaginal: Confirms               Anus:  normal sphincter tone, no lesions     Chaperone, Ina Homes, CMA, was present for exam.  Assessment/Plan: 1. Well woman exam with routine gynecological exam - Pap smear 03/27/2022.  Not indicated today. - Mammogram 02/06/2022 - Colonoscopy 08/04/2019 - Bone mineral density not indicated due to age - lab work done with PCP, Dr. Kateri Plummer - vaccines reviewed/updated  2. Migraine without aura and without status migrainosus, not intractable  3. Postmenopausal  4. History of salpingoophorectomy - 2019 due to serous cystadenoma  5. Dog bite, sequela - she is concerned about her scar.  Will reach out to plastic surgery, Dr. Ulice Bold, for input

## 2023-05-21 ENCOUNTER — Other Ambulatory Visit (HOSPITAL_BASED_OUTPATIENT_CLINIC_OR_DEPARTMENT_OTHER): Payer: Self-pay | Admitting: Obstetrics & Gynecology

## 2023-05-21 ENCOUNTER — Encounter (HOSPITAL_BASED_OUTPATIENT_CLINIC_OR_DEPARTMENT_OTHER): Payer: Self-pay | Admitting: Obstetrics & Gynecology

## 2023-05-21 DIAGNOSIS — L905 Scar conditions and fibrosis of skin: Secondary | ICD-10-CM

## 2023-08-02 DIAGNOSIS — E785 Hyperlipidemia, unspecified: Secondary | ICD-10-CM | POA: Diagnosis not present

## 2023-08-02 DIAGNOSIS — Z0000 Encounter for general adult medical examination without abnormal findings: Secondary | ICD-10-CM | POA: Diagnosis not present

## 2023-08-13 NOTE — Progress Notes (Signed)
PATIENT: Rachel Anthony DOB: [birth year]  REASON FOR VISIT: follow up for migraine headache HISTORY FROM: patient Primary Neurologist: Dr. Vinson Moselle   HISTORY OF PRESENT ILLNESS: Today 08/14/23  Had surgery to left leg last year following dog bite had to have wound vac. Now having right sided hip pain from not being able to use the left side. Was placed on diclofenac. Having more headaches, since not taking NSAID at time of headache. Will take Nurtec, using all 8 a month. This past month 6 migraines severe. Total 1-2 headaches a week. The intensity of migraine has increased. Taking Zonegran 50 mg at bedtime. Tried ubrelvy without any benefit. Denies symptoms of OSA.   Update 08/10/22 SS: Recovering from a dog attack few weeks ago, has surgical consult later today. 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  Migraines continue.We switched from Topamax to Zonegran, no problems, not a huge difference. Gets 8 tablets of Nurtec monthly, would be rare to use all tablets monthly.  Nurtec works but does take 2 hours. Tried Fioricet, it took 6 hours, didn't work well. Insurance is difficult to to work with, can't use copay cards for newer medications.   Update 01/26/22 SS: Rachel Anthony is here today for follow-up. Has found generic Relpax to not be helpful. For Nurtec, it takes 2 hours to get any relief. Brand name Relpax for sure works the best, but is too expensive. Called Nurtec got drug company assistance, gets # 8 a month. Having 3 migraines a month. Her insurance will not pay for any CGRP injection, is too expensive, can't use co-pay card. Taking Topamax 50 mg twice daily. Feels foggy, worries about long term side effect.  Had tremor with Depakote in the past.  Update 07/28/21 SS: Rachel Anthony is here today for follow-up with history of migraine headache. Last visit sent in Ajovy, never started it, due to insurance issues. Claims generic Relpax didn't work as well a brand name, generic takes 5-6 hours to get  relief. Having to take all 6 tablets of Relpax every month. About 6 migraines a month, having some mild headaches, related to allergies. Taking Topamax 50 mg twice daily. In past has tried Imitrex and Maxalt.   Update 01/20/21 SS: Rachel Anthony here today to follow-up on migraines. 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 Has migraine today, this 1 is from interrupted sleep, took daughter to airport, normally Relpax works great. Remains on Topamax 50 mg twice daily. Insurance wouldn't cover Ajovy. Is on brand name Relpax, $350, insurance only allow 6 tablets, wants to try generic. Rarely has to take 2nd. On average having 2-3 times a week having migraines, working a lot of overtime.    Update 01/19/2020 SS: Rachel Anthony 58 year old female with history of migraines.  She is on Topamax 50 mg twice a day, brand-name Relpax as needed.  Has had an increase in headaches due to work related stress.  She is a Physicist, medical.  On average, 1-2 headaches a week, 1-2 migraines a month, headaches come in clusters, could have headache for several days that relpax won't touch.  She has previously tried Depakote, amitriptyline, is currently on Topamax.  Does have mild side effect of Topamax with brain fog.  Relpax is effective usually, but is expensive over $100 a month for 6 tablets, uses card, prices goes up subsequent months.  Some months, she takes her entire supply.  For mild headaches, takes Excedrin Migraine she is careful with diet to avoid food triggers, no soft drinks.  Overall health is good.  Presents today for evaluation unaccompanied.  HISTORY 12/12/2018 SS: Rachel Anthony is a 58 year old female with history of migraines since the 3rd grade.  She is currently taking Topamax 50 mg twice daily, BRAND Relpax as needed.  She is a Physicist, medical.  She has been doing virtual visits for work, with the switch to working on the computer more, has had a increase in frequency of her headaches.  She reports she has been  having a few migraines weekly.  She will take Excedrin Migraine or Relpax.  The Relpax works well, this is expensive, she only gets 6 tablets/month. She describes her migraine as occurring the left side, has photophobia, nausea.  Bright lights, smells, nitrates, exercise is a trigger for the headaches.  In the past she has tried Depakote, but had side effect of tremor.    REVIEW OF SYSTEMS: Out of a complete 14 system review of symptoms, the patient complains only of the following symptoms, and all other reviewed systems are negative.  Headache  ALLERGIES: No Known Allergies  HOME MEDICATIONS: Outpatient Medications Prior to Visit  Medication Sig Dispense Refill   diclofenac (VOLTAREN) 75 MG EC tablet Take 1 tablet by mouth 2 (two) times daily with a meal.     loratadine (CLARITIN) 10 MG tablet Take 10 mg by mouth as needed for allergies.     Rimegepant Sulfate (NURTEC) 75 MG TBDP Take one tablet daily as needed for acute migraine. #8 allowed per 30 days. 8 tablet 11   zonisamide (ZONEGRAN) 50 MG capsule Take 1 capsule (50 mg total) by mouth at bedtime. 90 capsule 3   predniSONE (DELTASONE) 5 MG tablet Take 5 mg by mouth daily with breakfast. 12 days     No facility-administered medications prior to visit.    PAST MEDICAL HISTORY: Past Medical History:  Diagnosis Date   Dog attack    72- 17 puncture wounds   Migraines    no aura   Sinus problem    STD (sexually transmitted disease)    HSV 2    PAST SURGICAL HISTORY: Past Surgical History:  Procedure Laterality Date   CESAREAN SECTION  2000   CYSTOSCOPY  06/24/2018   Procedure: CYSTOSCOPY;  Surgeon: Jerene Bears, MD;  Location[facility];  Service: Gynecology;;   LAPAROSCOPIC OVARIAN CYSTECTOMY Left 06/24/2018   Procedure: LAPAROSCOPIC OOPHERECTOMY LYSIS OF ADHESIONS;  Surgeon: Jerene Bears, MD;  Location: [facility];  Service: Gynecology;  Laterality: Left;   LAPAROSCOPIC UNILATERAL  SALPINGECTOMY Right 06/24/2018   Procedure: LAPAROSCOPIC BILATERAL SALPINGECTOMY;  Surgeon: Jerene Bears, MD;  Location: [facility];  Service: Gynecology;  Laterality: Right;   POLYPECTOMY  2010    FAMILY HISTORY: Family History  Problem Relation Age of Onset   Hypertension Mother    Colon cancer Paternal Grandmother    Heart disease Maternal Grandmother     SOCIAL HISTORY: Social History   Socioeconomic History   Marital status: Divorced    Spouse name: Not on file   Number of children: 1   Years of education: 12+   Highest education level: Master's degree (e.g., MA, MS, MEng, MEd, MSW, MBA)  Occupational History    Employer: [employer]  Tobacco Use   Smoking status: Never   Smokeless tobacco: Never  Vaping Use   Vaping status: Never Used  Substance and Sexual Activity   Alcohol use: No   Drug use: No  Sexual activity: Not Currently    Partners: Male    Birth control/protection: Post-menopausal  Other Topics Concern   Not on file  Social History Narrative   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 The patient is divorced and lives with her daughter.    Social Drivers of Corporate investment banker Strain: Not on file  Food Insecurity: Not on file  Transportation Needs: Not on file  Physical Activity: Not on file  Stress: Not on file  Social Connections: Not on file  Intimate Partner Violence: Not on file   PHYSICAL EXAM  Vitals:   08/14/23 0736  BP: (!) 155/81  Pulse: 78  Weight: 186 lb (84.4 kg)  Height: 5' (1.524 m)   Body mass index is 36.33 kg/m.  Generalized: Well developed, in no acute distress  Neurological examination  Mentation: Alert oriented to time, place, history taking. Follows all commands speech and language fluent Cranial nerve II-XII: Pupils were equal round reactive to light. Extraocular movements were full, visual  field were full on confrontational test. Facial sensation and strength were normal. Head turning and shoulder shrug  were normal and symmetric. Motor: The motor testing reveals 5 over 5 strength of all 4 extremities. Good symmetric motor tone is noted throughout.  Sensory: Sensory testing is intact to soft touch on all 4 extremities. No evidence of extinction is noted.  Coordination: Cerebellar testing reveals good finger-nose-finger and heel-to-shin bilaterally.  Gait and station: Gait is normal.    DIAGNOSTIC DATA (LABS, IMAGING, TESTING) - I reviewed patient records, labs, notes, testing and imaging myself where available.  Lab Results  Component Value Date   WBC 5.8 03/24/2021   HGB 13.7 03/24/2021   HCT 41.8 03/24/2021   MCV 91 03/24/2021   PLT 187 03/24/2021      Component Value Date/Time   NA 144 03/24/2021 0903   K 4.3 03/24/2021 0903   CL 107 (H) 03/24/2021 0903   CO2 20 03/24/2021 0903   GLUCOSE 89 03/24/2021 0903   BUN 16 03/24/2021 0903   CREATININE 0.79 03/24/2021 0903   CALCIUM 9.4 03/24/2021 0903   PROT 7.1 03/24/2021 0903   ALBUMIN 4.8 03/24/2021 0903   AST 21 03/24/2021 0903   ALT 14 03/24/2021 0903   ALKPHOS 97 03/24/2021 0903   BILITOT 0.8 03/24/2021 0903   Lab Results  Component Value Date   CHOL 221 (H) 03/24/2021   HDL 91 03/24/2021   LDLCALC 121 (H) 03/24/2021   TRIG 50 03/24/2021   CHOLHDL 2.4 03/24/2021   No results found for: "HGBA1C" No results found for: "VITAMINB12" Lab Results  Component Value Date   TSH 2.740 03/24/2021   ASSESSMENT AND PLAN 58 y.o. year old female  has a past medical history of Dog attack, Migraines, Sinus problem, and STD (sexually transmitted disease). here with:  1.  Chronic migraine headaches  -Reorder Ajovy 225 mg monthly injection for migraine prevention.  Determine insurance coverage, cost.  Has high deductible plan.  Patient is willing to pay to meet deductible if helpful.  If high cost, can sample up to 3  months before she pays high cost to make sure it works for her.  -For now, continue Zonegran 50 mg at bedtime for migraine prevention, plan to wean if can get on CGRP with benefit -Continue Nurtec 75 mg for acute headache treatment, has held off on combining with NSAID due to being  on BID diclofenac -BP elevated today, keep log, discuss with PCP -Previously tried and failed: Depakote, Topamax, amitriptyline, Fioricet, Relpax, Imitrex, Maxalt, Bernita Raisin -Next steps: Qulipta, Emgality-Relpax brand works Adult nurse for her but was cost prohibitive -Follow-up in 6 months or sooner if needed virtually  Otila Kluver, DNP 08/14/2023, 7:48 AM [facility] [address], [address] [place], [place] [address] [phone]

## 2023-08-14 ENCOUNTER — Encounter: Payer: Self-pay | Admitting: Neurology

## 2023-08-14 ENCOUNTER — Ambulatory Visit (INDEPENDENT_AMBULATORY_CARE_PROVIDER_SITE_OTHER): Payer: BC Managed Care – PPO | Admitting: Neurology

## 2023-08-14 VITALS — BP 155/81 | HR 78 | Ht 60.0 in | Wt 186.0 lb

## 2023-08-14 DIAGNOSIS — G43009 Migraine without aura, not intractable, without status migrainosus: Secondary | ICD-10-CM

## 2023-08-14 MED ORDER — AJOVY 225 MG/1.5ML ~~LOC~~ SOAJ: 225.0000 mg | SUBCUTANEOUS | 11 refills | Status: DC

## 2023-08-14 MED ORDER — NURTEC 75 MG PO TBDP: ORAL_TABLET | ORAL | 11 refills | Status: AC

## 2023-08-14 MED ORDER — ZONISAMIDE 50 MG PO CAPS: 50.0000 mg | ORAL_CAPSULE | Freq: Every day | ORAL | 3 refills | Status: AC

## 2023-08-14 NOTE — Patient Instructions (Addendum)
Try the Ajovy for migraine prevention, find out the cost, will be willing to provide samples for 3 months to see benefit before paying the cost. Also check with [facility] and co-pay.   Keep an eye on your BP, if continues to be elevated follow up with primary care  Continue the Zonegran, Nurtec as needed

## 2023-08-20 ENCOUNTER — Telehealth: Payer: Self-pay | Admitting: Pharmacist

## 2023-08-20 NOTE — Telephone Encounter (Signed)
 Pharmacy Patient Advocate Encounter   Received notification from Patient Pharmacy that prior authorization for Nurtec 75MG  dispersible tablets is required/requested.   Insurance verification completed.   The patient is insured through [facility] .   Per test claim: PA required; PA submitted to above mentioned insurance via CoverMyMeds Key/confirmation #/EOC [id number] Status is pending

## 2023-08-21 NOTE — Telephone Encounter (Signed)
 Pharmacy Patient Advocate Encounter  Received notification from [facility] that Prior Authorization for Nurtec 75MG  dispersible tablets  has been APPROVED from 08/20/2023 to 08/19/2024   PA #/Case ID/Reference #: [id number]

## 2023-09-19 ENCOUNTER — Telehealth: Payer: Self-pay | Admitting: Neurology

## 2023-09-19 NOTE — Telephone Encounter (Signed)
 LVM and sent MyChart msg informing pt of r/s needed for 8/13 appt- Maralyn Sago out.

## 2023-09-27 NOTE — Telephone Encounter (Signed)
 Pt rescheduled appt on 02/20/24-at 3:00pm

## 2024-02-13 ENCOUNTER — Telehealth: Payer: BC Managed Care – PPO | Admitting: Neurology

## 2024-02-20 ENCOUNTER — Telehealth (INDEPENDENT_AMBULATORY_CARE_PROVIDER_SITE_OTHER): Admitting: Neurology

## 2024-02-20 DIAGNOSIS — G43709 Chronic migraine without aura, not intractable, without status migrainosus: Secondary | ICD-10-CM

## 2024-02-20 DIAGNOSIS — G43009 Migraine without aura, not intractable, without status migrainosus: Secondary | ICD-10-CM

## 2024-02-20 MED ORDER — QULIPTA 60 MG PO TABS: 60.0000 mg | ORAL_TABLET | Freq: Every day | ORAL | 11 refills | Status: DC

## 2024-02-20 MED ORDER — ELETRIPTAN HYDROBROMIDE 40 MG PO TABS
40.0000 mg | ORAL_TABLET | ORAL | 5 refills | Status: AC | PRN
Start: 2024-02-20 — End: ?

## 2024-02-20 NOTE — Patient Instructions (Signed)
 We will start Qulipta  60 mg daily for migraine prevention.  Please let me know if you have an issue with insurance or cost.  Ideally we will wean you off Zonegran  once started on Qulipta .  Keep me posted.  Follow-up in 6 months.  Thanks!!

## 2024-02-20 NOTE — Progress Notes (Signed)
 PATIENT: Rachel Anthony DOB: 1965/08/19  REASON FOR VISIT: follow up for migraine headache HISTORY FROM: patient Primary Neurologist: Dr. Eleno   Virtual Visit via Video Note  I connected with Rachel Anthony on 02/20/24 at  3:00 PM EDT by a video enabled telemedicine application and verified that I am speaking with the correct person using two identifiers.  Location: Patient: at her home Provider: in the office    I discussed the limitations of evaluation and management by telemedicine and the availability of in person appointments. The patient expressed understanding and agreed to proceed.  HISTORY OF PRESENT ILLNESS: Today 02/20/24  Via VV, chronic issues from wound from dog bite, hip issues, has diclofenac for the pain, stopped it. 1-2 headaches a week, since stopped diclofenac, started taking Aleve to see if helped hip better. 1-2 headaches a week, 1 a week is migraine. Insurance won't pay for Ajovy . Remains on Zonegran  50 mg. Nurtec helps, but takes 2 hours. Generic Relpax  didn't work as well, brand name works Firefighter.   08/14/23 SS: Had surgery to left leg last year following dog bite had to have wound vac. Now having right sided hip pain from not being able to use the left side. Was placed on diclofenac. Having more headaches, since not taking NSAID at time of headache. Will take Nurtec, using all 8 a month. This past month 6 migraines severe. Total 1-2 headaches a week. The intensity of migraine has increased. Taking Zonegran  50 mg at bedtime. Tried ubrelvy without any benefit. Denies symptoms of OSA.   Update 08/10/22 SS: Recovering from a dog attack few weeks ago, has surgical consult later today. 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  Migraines continue.We switched from Topamax  to Zonegran , no problems, not a huge difference. Gets 8 tablets of Nurtec monthly, would be rare to use all tablets monthly.  Nurtec works but does take 2 hours. Tried Fioricet, it took 6 hours,  didn't work well. Insurance is difficult to to work with, can't use copay cards for newer medications.   Update 01/26/22 SS: Rachel is here today for follow-up. Has found generic Relpax  to not be helpful. For Nurtec, it takes 2 hours to get any relief. Brand name Relpax  for sure works the best, but is too expensive. Called Nurtec got drug company assistance, gets # 8 a month. Having 3 migraines a month. Her insurance will not pay for any CGRP injection, is too expensive, can't use co-pay card. Taking Topamax  50 mg twice daily. Feels foggy, worries about long term side effect.  Had tremor with Depakote in the past.  Update 07/28/21 SS: Rachel is here today for follow-up with history of migraine headache. Last visit sent in Ajovy , never started it, due to insurance issues. Claims generic Relpax  didn't work as well a brand name, generic takes 5-6 hours to get relief. Having to take all 6 tablets of Relpax  every month. About 6 migraines a month, having some mild headaches, related to allergies. Taking Topamax  50 mg twice daily. In past has tried Imitrex and Maxalt.   Update 01/20/21 SS: Rachel Anthony here today to follow-up on migraines. 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 Has migraine today, this 1 is from interrupted sleep, took daughter to airport, normally Relpax  works great. Remains on Topamax  50 mg twice daily. Insurance wouldn't cover Ajovy . Is on brand name Relpax , $350, insurance only allow 6 tablets, wants to try generic. Rarely has to take 2nd. On average having 2-3 times a week  having migraines, working a lot of overtime.    Update 01/19/2020 SS: Rachel Anthony 58 year old female with history of migraines.  She is on Topamax  50 mg twice a day, brand-name Relpax  as needed.  Has had an increase in headaches due to work related stress.  She is a Physicist, medical.  On average, 1-2 headaches a week, 1-2 migraines a month, headaches come in clusters, could have headache for several days that relpax  won't touch.   She has previously tried Depakote, amitriptyline, is currently on Topamax .  Does have mild side effect of Topamax  with brain fog.  Relpax  is effective usually, but is expensive over $100 a month for 6 tablets, uses card, prices goes up subsequent months.  Some months, she takes her entire supply.  For mild headaches, takes Excedrin Migraine she is careful with diet to avoid food triggers, no soft drinks.  Overall health is good.  Presents today for evaluation unaccompanied.  HISTORY 12/12/2018 SS: Rachel Anthony is a 58 year old female with history of migraines since the 3rd grade.  She is currently taking Topamax  50 mg twice daily, BRAND Relpax  as needed.  She is a Physicist, medical.  She has been doing virtual visits for work, with the switch to working on the computer more, has had a increase in frequency of her headaches.  She reports she has been having a few migraines weekly.  She will take Excedrin Migraine or Relpax .  The Relpax  works well, this is expensive, she only gets 6 tablets/month. She describes her migraine as occurring the left side, has photophobia, nausea.  Bright lights, smells, nitrates, exercise is a trigger for the headaches.  In the past she has tried Depakote, but had side effect of tremor.    REVIEW OF SYSTEMS: Out of a complete 14 system review of symptoms, the patient complains only of the following symptoms, and all other reviewed systems are negative.  Headache  ALLERGIES: No Known Allergies  HOME MEDICATIONS: Outpatient Medications Prior to Visit  Medication Sig Dispense Refill   diclofenac (VOLTAREN) 75 MG EC tablet Take 1 tablet by mouth 2 (two) times daily with a meal.     Fremanezumab -vfrm (AJOVY ) 225 MG/1.5ML SOAJ Inject 225 mg into the skin every 30 (thirty) days. 1.68 mL 11   loratadine (CLARITIN) 10 MG tablet Take 10 mg by mouth as needed for allergies.     Rimegepant Sulfate (NURTEC) 75 MG TBDP Take one tablet daily as needed for acute migraine. #8 allowed  per 30 days. 8 tablet 11   zonisamide  (ZONEGRAN ) 50 MG capsule Take 1 capsule (50 mg total) by mouth at bedtime. 90 capsule 3   No facility-administered medications prior to visit.    PAST MEDICAL HISTORY: Past Medical History:  Diagnosis Date   Dog attack    63- 17 puncture wounds   Migraines    no aura   Sinus problem    STD (sexually transmitted disease)    HSV 2    PAST SURGICAL HISTORY: Past Surgical History:  Procedure Laterality Date   CESAREAN SECTION  2000   CYSTOSCOPY  06/24/2018   Procedure: CYSTOSCOPY;  Surgeon: Cleotilde Ronal RAMAN, MD;  [facility];  Service: Gynecology;;   LAPAROSCOPIC OVARIAN CYSTECTOMY Left 06/24/2018   Procedure: LAPAROSCOPIC OOPHERECTOMY LYSIS OF ADHESIONS;  Surgeon: Cleotilde Ronal RAMAN, MD;  Location[facility];  Service: Gynecology;  Laterality: Left;   LAPAROSCOPIC UNILATERAL SALPINGECTOMY Right 06/24/2018   Procedure: LAPAROSCOPIC BILATERAL SALPINGECTOMY;  Surgeon:  Cleotilde Ronal RAMAN, MD;  Location: [facility];  Service: Gynecology;  Laterality: Right;   POLYPECTOMY  2010    FAMILY HISTORY: Family History  Problem Relation Age of Onset   Hypertension Mother    Colon cancer Paternal Grandmother    Heart disease Maternal Grandmother     SOCIAL HISTORY: Social History   Socioeconomic History   Marital status: Divorced    Spouse name: Not on file   Number of children: 1   Years of education: 12+   Highest education level: Master's degree (e.g., MA, MS, MEng, MEd, MSW, MBA)  Occupational History    Employer: [employer]  Tobacco Use   Smoking status: Never   Smokeless tobacco: Never  Vaping Use   Vaping status: Never Used  Substance and Sexual Activity   Alcohol use: No   Drug use: No   Sexual activity: Not Currently    Partners: Male    Birth control/protection: Post-menopausal  Other Topics Concern   Not on file  Social History Narrative   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 The patient is divorced and lives with her daughter.    Social Drivers of Corporate investment banker Strain: Not on file  Food Insecurity: Not on file  Transportation Needs: Not on file  Physical Activity: Not on file  Stress: Not on file  Social Connections: Not on file  Intimate Partner Violence: Not on file   PHYSICAL EXAM  There were no vitals filed for this visit.  There is no height or weight on file to calculate BMI.  Generalized: Well developed, in no acute distress  Via video visit, patient is alert and oriented, speech is clear and concise, facial symmetry noted, moves about freely   DIAGNOSTIC DATA (LABS, IMAGING, TESTING) - I reviewed patient records, labs, notes, testing and imaging myself where available.  Lab Results  Component Value Date   WBC 5.8 03/24/2021   HGB 13.7 03/24/2021   HCT 41.8 03/24/2021   MCV 91 03/24/2021   PLT 187 03/24/2021      Component Value Date/Time   NA 144 03/24/2021 0903   K 4.3 03/24/2021 0903   CL 107 (H) 03/24/2021 0903   CO2 20 03/24/2021 0903   GLUCOSE 89 03/24/2021 0903   BUN 16 03/24/2021 0903   CREATININE 0.79 03/24/2021 0903   CALCIUM 9.4 03/24/2021 0903   PROT 7.1 03/24/2021 0903   ALBUMIN 4.8 03/24/2021 0903   AST 21 03/24/2021 0903   ALT 14 03/24/2021 0903   ALKPHOS 97 03/24/2021 0903   BILITOT 0.8 03/24/2021 0903   Lab Results  Component Value Date   CHOL 221 (H) 03/24/2021   HDL 91 03/24/2021   LDLCALC 121 (H) 03/24/2021   TRIG 50 03/24/2021   CHOLHDL 2.4 03/24/2021   No results found for: HGBA1C No results found for: VITAMINB12 Lab Results  Component Value Date   TSH 2.740 03/24/2021   ASSESSMENT AND PLAN 58 y.o. year old female  has a past medical history of Dog attack, Migraines, Sinus problem, and STD (sexually transmitted disease). here with:  1.  Chronic migraine headaches  -  About 1-2 migraines weekly  - Start Qulipta  60 mg daily for migraine prevention - For now, continue Zonegran  50 mg daily for migraine preventative, once on Qulipta  to see if we can wean off Zonegran  - Try generic Relpax , brand-name  Relpax  works excellent but insurance will not pay for - Continue Nurtec 75 mg tablet as needed for acute migraine - Previously tried and failed: Depakote, Topamax , amitriptyline, Fioricet, Relpax , Imitrex, Maxalt, Ubrelvy, Ajovy  was too expensive per patient - Follow-up in 6 months or sooner if needed virtually  Lauraine Born, SCHARLENE, DNP 02/20/2024, 3:10 PM [facility] [address] [place], [place] [address] [phone]

## 2024-02-22 ENCOUNTER — Other Ambulatory Visit (HOSPITAL_COMMUNITY): Payer: Self-pay

## 2024-02-22 ENCOUNTER — Telehealth: Payer: Self-pay

## 2024-02-22 NOTE — Telephone Encounter (Signed)
 I received a fax requesting PA on this med-per test claim PA is needed. Per CMM submission no pa needed. The test claim rejection stated this was a plan exclusion. I will outreach PT plan to verify

## 2024-02-26 ENCOUNTER — Other Ambulatory Visit (HOSPITAL_COMMUNITY): Payer: Self-pay

## 2024-02-28 NOTE — Telephone Encounter (Signed)
 Received PA form for PA.

## 2024-02-29 NOTE — Telephone Encounter (Signed)
 Faxed completed form along with clinicals to [facility] at [phone].

## 2024-03-04 NOTE — Telephone Encounter (Signed)
 Pharmacy Patient Advocate Encounter  Received notification from [facility] that Prior Authorization for Eletriptan  40mg  Tablet has been DENIED.  Full denial letter will be uploaded to the media tab. See denial reason below.   PA #/Case ID/Reference #: [id number]

## 2024-03-24 ENCOUNTER — Telehealth: Payer: Self-pay

## 2024-03-24 ENCOUNTER — Other Ambulatory Visit (HOSPITAL_COMMUNITY): Payer: Self-pay

## 2024-03-24 NOTE — Telephone Encounter (Signed)
 Pharmacy Patient Advocate Encounter   Received notification from CoverMyMeds that prior authorization for Qulipta  is required/requested.   Insurance verification completed.   The patient is insured through [facility] .   Per test claim: PA required; PA submitted to above mentioned insurance via Latent Key/confirmation #/EOC [id number] Status is pending

## 2024-03-25 MED ORDER — EMGALITY 120 MG/ML ~~LOC~~ SOAJ: 240.0000 mg | SUBCUTANEOUS | 0 refills | Status: DC

## 2024-03-25 MED ORDER — EMGALITY 120 MG/ML ~~LOC~~ SOAJ: 120.0000 mg | SUBCUTANEOUS | 11 refills | Status: DC

## 2024-03-25 NOTE — Telephone Encounter (Signed)
 Insurance denied Qulipta . Will try Emgality .  Meds ordered this encounter  Medications   Galcanezumab -gnlm (EMGALITY ) 120 MG/ML SOAJ    Sig: Inject 240 mg into the skin every 30 (thirty) days.    Dispense:  2 mL    Refill:  0    Loading dose   Galcanezumab -gnlm (EMGALITY ) 120 MG/ML SOAJ    Sig: Inject 120 mg into the skin every 30 (thirty) days.    Dispense:  1.12 mL    Refill:  11    Fill this 2nd please

## 2024-03-25 NOTE — Telephone Encounter (Signed)
 Pharmacy Patient Advocate Encounter  Received notification from [facility] that Prior Authorization for Qulipta  has been DENIED.  Full denial letter will be uploaded to the media tab. See denial reason below.   PA #/Case ID/Reference #: [id number]

## 2024-03-27 ENCOUNTER — Other Ambulatory Visit: Payer: Self-pay | Admitting: Family Medicine

## 2024-03-27 ENCOUNTER — Other Ambulatory Visit (HOSPITAL_COMMUNITY): Payer: Self-pay

## 2024-03-27 ENCOUNTER — Telehealth: Payer: Self-pay

## 2024-03-27 DIAGNOSIS — Z1231 Encounter for screening mammogram for malignant neoplasm of breast: Secondary | ICD-10-CM

## 2024-03-27 NOTE — Telephone Encounter (Signed)
 Pharmacy Patient Advocate Encounter   Received notification from Physician's Office that prior authorization for Emgality  is required/requested.   Insurance verification completed.   The patient is insured through [facility] .   Per test claim: PA required; PA submitted to above mentioned insurance via Latent Key/confirmation #/[id number] Status is pending

## 2024-03-27 NOTE — Telephone Encounter (Signed)
 PA request has been Submitted. New Encounter has been or will be created for follow up. For additional info see Pharmacy Prior Auth telephone encounter from 03/27/2024.

## 2024-03-28 ENCOUNTER — Other Ambulatory Visit (HOSPITAL_COMMUNITY): Payer: Self-pay

## 2024-03-28 NOTE — Telephone Encounter (Signed)
 Pharmacy Patient Advocate Encounter  Received notification from [facility] that Prior Authorization for Emgality  has been APPROVED from 03/28/2024 to 06/19/2024. Ran test claim, Copay is $35.00. This test claim was processed through [facility]- copay amounts may vary at other pharmacies due to pharmacy/plan contracts, or as the patient moves through the different stages of their insurance plan.   PA #/Case ID/Reference #: [id number]  Loading dose has been included in PA Approval.  I sent PT a MyChart message about approval.

## 2024-04-04 ENCOUNTER — Ambulatory Visit
Admission: RE | Admit: 2024-04-04 | Discharge: 2024-04-04 | Disposition: A | Discharge status: Home / Self Care | Source: Ambulatory Visit | Attending: Family Medicine | Admitting: Family Medicine

## 2024-04-04 DIAGNOSIS — Z1231 Encounter for screening mammogram for malignant neoplasm of breast: Secondary | ICD-10-CM

## 2024-05-19 ENCOUNTER — Ambulatory Visit (HOSPITAL_BASED_OUTPATIENT_CLINIC_OR_DEPARTMENT_OTHER): Payer: BC Managed Care – PPO | Admitting: Obstetrics & Gynecology

## 2024-06-04 ENCOUNTER — Encounter (HOSPITAL_BASED_OUTPATIENT_CLINIC_OR_DEPARTMENT_OTHER): Payer: Self-pay | Admitting: Obstetrics & Gynecology

## 2024-06-04 ENCOUNTER — Ambulatory Visit (HOSPITAL_BASED_OUTPATIENT_CLINIC_OR_DEPARTMENT_OTHER): Admitting: Obstetrics & Gynecology

## 2024-06-04 VITALS — BP 133/85 | HR 74 | Ht 65.5 in | Wt 181.4 lb

## 2024-06-04 DIAGNOSIS — Z01419 Encounter for gynecological examination (general) (routine) without abnormal findings: Secondary | ICD-10-CM

## 2024-06-04 DIAGNOSIS — Z780 Asymptomatic menopausal state: Secondary | ICD-10-CM | POA: Diagnosis not present

## 2024-06-04 DIAGNOSIS — G43009 Migraine without aura, not intractable, without status migrainosus: Secondary | ICD-10-CM

## 2024-06-04 DIAGNOSIS — Z1331 Encounter for screening for depression: Secondary | ICD-10-CM

## 2024-06-04 NOTE — Progress Notes (Signed)
 ANNUAL EXAM Patient name: Rachel Anthony MRN [id number]  Date of birth: December 08, 1965 Chief Complaint:   Gynecologic Exam  History of Present Illness:   Rachel Anthony is a 58 y.o. G5P1001 Caucasian female being seen today for a routine annual exam.  Doing well.  Denies vaginal bleeding.  Still having right hip issues since having dog attach in 06/2022.  She is seeing DR. Ernie.  She is still dealing with workers comp.  Has seen plastic surgery at [facility].  Patient's last menstrual period was 08/01/2018 (approximate).  Last pap 03/27/2022. Results were: NILM w/ HRHPV negative. H/O abnormal pap: no Last mammogram: 04/04/2024. Results were: normal. Family h/o breast cancer: no Last colonoscopy: 08/04/2019. Results were: normal. Family h/o colorectal cancer: yes paternal grandmother DEXA:  discussed today     06/04/2024    2:07 PM 05/16/2023   10:36 AM 03/27/2022    9:02 AM 03/24/2021    8:24 AM  Depression screen PHQ 2/9  Decreased Interest 0 0 0 0  Down, Depressed, Hopeless 0 0 0 0  PHQ - 2 Score 0 0 0 0    Review of Systems:   Pertinent items are noted in HPI Denies any urinary or bowel changes.  Denies pelvic pain.   Pertinent History Reviewed:  Reviewed past medical,surgical, social and family history.  Reviewed problem list, medications and allergies. Physical Assessment:   Vitals:   06/04/24 1405  BP: 133/85  Pulse: 74  SpO2: 100%  Weight: 181 lb 6.4 oz (82.3 kg)  Height: 5' 5.5 (1.664 m)  Body mass index is 29.73 kg/m.        Physical Examination:   General appearance - well appearing, and in no distress  Mental status - alert, oriented to person, place, and time  Psych:  She has a normal mood and affect  Skin - warm and dry, normal color, no suspicious lesions noted  Chest - effort normal, all lung fields clear to auscultation bilaterally  Heart - normal rate and regular rhythm  Neck:  midline trachea, no thyromegaly or nodules  Breasts - breasts appear  normal, no suspicious masses, no skin or nipple changes or  axillary nodes  Abdomen - soft, nontender, nondistended, no masses or organomegaly  Pelvic - VULVA: normal appearing vulva with no masses, tenderness or lesions   VAGINA: normal appearing vagina with normal color and discharge, no lesions   CERVIX: normal appearing cervix without discharge or lesions, no CMT  Thin prep pap is HR HPV cotesting  UTERUS: uterus is felt to be normal size, shape, consistency and nontender   ADNEXA: No adnexal masses or tenderness noted.  Rectal - normal rectal, good sphincter tone, no masses felt.  Extremities:  No swelling or varicosities noted  Chaperone present for exam  No results found for this or any previous visit (from the past 24 hours).  Assessment & Plan:  1. Well woman exam with routine gynecological exam (Primary) - Pap smear 2024.  Not indicated today. - Mammogram 04/04/2024 - Colonoscopy 08/04/2019.  - Bone mineral density guidelines discussed. - lab work done with PCP, Dr. Kip - vaccines reviewed/updated  2. Postmenopausal - not on HRT  3. Migraine without aura and without status migrainosus, not intractable - followed by neurology   No orders of the defined types were placed in this encounter.   Meds: No orders of the defined types were placed in this encounter.   Follow-up: Return in about 1 year (around 06/04/2025).  Ronal  GORMAN Pinal, MD 06/04/2024 2:52 PM

## 2024-06-20 ENCOUNTER — Telehealth: Payer: Self-pay

## 2024-06-20 NOTE — Telephone Encounter (Signed)
 Hello Prescriber!  We are in the process of submitting a prior authorization for your patient for Emgality  renewal. We are reaching out for clinical guidance for the following information to complete the request: Plan requiring documentation of clinical benefit since starting therapy.      Thank you! Pharmacy Team

## 2024-06-20 NOTE — Telephone Encounter (Signed)
 Pharmacy Patient Advocate Encounter   Received notification from CoverMyMeds that prior authorization for Emgality  is required/requested.   Insurance verification completed.   The patient is insured through [facility].   Per test claim: PA required; PA started via CoverMyMeds. KEY [id number] . Waiting for clinical questions to populate.

## 2024-06-23 ENCOUNTER — Other Ambulatory Visit (HOSPITAL_COMMUNITY): Payer: Self-pay

## 2024-06-23 NOTE — Telephone Encounter (Signed)
 [employer] sent me a continuation form-PT never picked up med after approval-tried to submit initial again however [employer] thinks PT has been on it-I will outreach [employer] to try to get this situated.

## 2024-06-23 NOTE — Telephone Encounter (Signed)
 Pharmacy Patient Advocate Encounter   Received notification from Physician's Office that prior authorization for Emgality  starting dose is required/requested.   Insurance verification completed.   The patient is insured through [facility].   Per test claim: PA required; PA submitted to above mentioned insurance via Latent Key/confirmation #/EOC [id number] Status is pending

## 2024-06-23 NOTE — Telephone Encounter (Signed)
 Pt stated that she never got the emgality  due to cost I will send her copay card in formation and notify pa team to resubmit as new start.

## 2024-06-27 ENCOUNTER — Other Ambulatory Visit (HOSPITAL_COMMUNITY): Payer: Self-pay

## 2024-06-27 NOTE — Telephone Encounter (Signed)
 I called [employer] and they were able to update this to an initial PA-awaiting determination.  Call Reference:[id number] 06/27/2024 12:55

## 2024-06-30 NOTE — Telephone Encounter (Signed)
 SABRA

## 2024-09-10 ENCOUNTER — Telehealth: Admitting: Neurology
